# Patient Record
Sex: Female | Born: 1945 | Race: White | Hispanic: No
Health system: Midwestern US, Community
[De-identification: ages and names within clinical notes are randomized; demographics above are authoritative.]

## PROBLEM LIST (undated history)

## (undated) DIAGNOSIS — Z1231 Encounter for screening mammogram for malignant neoplasm of breast: Secondary | ICD-10-CM

## (undated) DIAGNOSIS — M48062 Spinal stenosis, lumbar region with neurogenic claudication: Secondary | ICD-10-CM

---

## 2019-02-15 ENCOUNTER — Emergency Department: Admit: 2019-02-15 | Payer: MEDICARE | Primary: Family Medicine

## 2019-02-15 ENCOUNTER — Inpatient Hospital Stay
Admit: 2019-02-15 | Discharge: 2019-02-15 | Disposition: A | Discharge status: Home / Self Care | Payer: MEDICARE | Attending: Emergency Medicine

## 2019-02-15 DIAGNOSIS — R102 Pelvic and perineal pain: Secondary | ICD-10-CM

## 2019-02-15 LAB — CBC WITH AUTO DIFFERENTIAL
Basophils %: 1 % (ref 0–1)
Basophils Absolute: 0.1 10*3/uL (ref 0.0–0.1)
Eosinophils %: 3 % (ref 0–7)
Eosinophils Absolute: 0.2 10*3/uL (ref 0.0–0.4)
Granulocyte Absolute Count: 0 10*3/uL (ref 0.00–0.04)
Hematocrit: 42.2 % (ref 35.0–47.0)
Hemoglobin: 13.6 g/dL (ref 11.5–16.0)
Immature Granulocytes: 0 % (ref 0.0–0.5)
Lymphocytes %: 18 % (ref 12–49)
Lymphocytes Absolute: 1 10*3/uL (ref 0.8–3.5)
MCH: 31.6 PG (ref 26.0–34.0)
MCHC: 32.2 g/dL (ref 30.0–36.5)
MCV: 97.9 FL (ref 80.0–99.0)
MPV: 10.7 FL (ref 8.9–12.9)
Monocytes %: 10 % (ref 5–13)
Monocytes Absolute: 0.6 10*3/uL (ref 0.0–1.0)
NRBC Absolute: 0 10*3/uL (ref 0.00–0.01)
Neutrophils %: 68 % (ref 32–75)
Neutrophils Absolute: 3.8 10*3/uL (ref 1.8–8.0)
Nucleated RBCs: 0 PER 100 WBC
Platelets: 189 10*3/uL (ref 150–400)
RBC: 4.31 M/uL (ref 3.80–5.20)
RDW: 11.8 % (ref 11.5–14.5)
WBC: 5.6 10*3/uL (ref 3.6–11.0)

## 2019-02-15 LAB — URINALYSIS WITH MICROSCOPIC
BACTERIA, URINE: NEGATIVE /hpf
Bilirubin, Urine: NEGATIVE
Blood, Urine: NEGATIVE
Glucose, Ur: NEGATIVE mg/dL
Ketones, Urine: NEGATIVE mg/dL
Leukocyte Esterase, Urine: NEGATIVE
Nitrite, Urine: NEGATIVE
Protein, UA: NEGATIVE mg/dL
Specific Gravity, UA: 1.006 (ref 1.003–1.030)
Urobilinogen, UA, POCT: 0.2 EU/dL (ref 0.2–1.0)
pH, UA: 7 (ref 5.0–8.0)

## 2019-02-15 LAB — COMPREHENSIVE METABOLIC PANEL
ALT: 19 U/L (ref 12–78)
AST: 15 U/L (ref 15–37)
Albumin/Globulin Ratio: 1.1 (ref 1.1–2.2)
Albumin: 3.3 g/dL — ABNORMAL LOW (ref 3.5–5.0)
Alkaline Phosphatase: 80 U/L (ref 45–117)
Anion Gap: 5 mmol/L (ref 5–15)
BUN: 14 MG/DL (ref 6–20)
Bun/Cre Ratio: 14 (ref 12–20)
CO2: 28 mmol/L (ref 21–32)
Calcium: 8.8 MG/DL (ref 8.5–10.1)
Chloride: 109 mmol/L — ABNORMAL HIGH (ref 97–108)
Creatinine: 0.98 MG/DL (ref 0.55–1.02)
EGFR IF NonAfrican American: 56 mL/min/{1.73_m2} — ABNORMAL LOW (ref >60)
GFR African American: >60 mL/min/{1.73_m2} (ref >60)
Globulin: 3.1 g/dL (ref 2.0–4.0)
Glucose: 85 mg/dL (ref 65–100)
Potassium: 3.8 mmol/L (ref 3.5–5.1)
Sodium: 142 mmol/L (ref 136–145)
Total Bilirubin: 0.5 MG/DL (ref 0.2–1.0)
Total Protein: 6.4 g/dL (ref 6.4–8.2)

## 2019-02-15 LAB — LIPASE
Lipase: 128 U/L (ref 73–393)
Lipase: 128 U/L (ref 73–393)

## 2019-02-15 LAB — URINALYSIS W/MICROSCOPIC
Bacteria: NEGATIVE /hpf
Bilirubin: NEGATIVE
Blood: NEGATIVE
Glucose: NEGATIVE mg/dL
Ketone: NEGATIVE mg/dL
Leukocyte Esterase: NEGATIVE
Nitrites: NEGATIVE
Protein: NEGATIVE mg/dL
Specific gravity: 1.006 (ref 1.003–1.030)
Urobilinogen: 0.2 EU/dL (ref 0.2–1.0)
pH (UA): 7 (ref 5.0–8.0)

## 2019-02-15 LAB — CBC WITH AUTOMATED DIFF
ABS. BASOPHILS: 0.1 10*3/uL (ref 0.0–0.1)
ABS. EOSINOPHILS: 0.2 10*3/uL (ref 0.0–0.4)
ABS. IMM. GRANS.: 0 10*3/uL (ref 0.00–0.04)
ABS. LYMPHOCYTES: 1 10*3/uL (ref 0.8–3.5)
ABS. MONOCYTES: 0.6 10*3/uL (ref 0.0–1.0)
ABS. NEUTROPHILS: 3.8 10*3/uL (ref 1.8–8.0)
ABSOLUTE NRBC: 0 10*3/uL (ref 0.00–0.01)
BASOPHILS: 1 % (ref 0–1)
EOSINOPHILS: 3 % (ref 0–7)
HCT: 42.2 % (ref 35.0–47.0)
HGB: 13.6 g/dL (ref 11.5–16.0)
IMMATURE GRANULOCYTES: 0 % (ref 0.0–0.5)
LYMPHOCYTES: 18 % (ref 12–49)
MCH: 31.6 PG (ref 26.0–34.0)
MCHC: 32.2 g/dL (ref 30.0–36.5)
MCV: 97.9 FL (ref 80.0–99.0)
MONOCYTES: 10 % (ref 5–13)
MPV: 10.7 FL (ref 8.9–12.9)
NEUTROPHILS: 68 % (ref 32–75)
NRBC: 0 PER 100 WBC
PLATELET: 189 10*3/uL (ref 150–400)
RBC: 4.31 M/uL (ref 3.80–5.20)
RDW: 11.8 % (ref 11.5–14.5)
WBC: 5.6 10*3/uL (ref 3.6–11.0)

## 2019-02-15 LAB — METABOLIC PANEL, COMPREHENSIVE
A-G Ratio: 1.1 (ref 1.1–2.2)
ALT (SGPT): 19 U/L (ref 12–78)
AST (SGOT): 15 U/L (ref 15–37)
Albumin: 3.3 g/dL — ABNORMAL LOW (ref 3.5–5.0)
Alk. phosphatase: 80 U/L (ref 45–117)
Anion gap: 5 mmol/L (ref 5–15)
BUN/Creatinine ratio: 14 (ref 12–20)
BUN: 14 MG/DL (ref 6–20)
Bilirubin, total: 0.5 MG/DL (ref 0.2–1.0)
CO2: 28 mmol/L (ref 21–32)
Calcium: 8.8 MG/DL (ref 8.5–10.1)
Chloride: 109 mmol/L — ABNORMAL HIGH (ref 97–108)
Creatinine: 0.98 MG/DL (ref 0.55–1.02)
GFR est AA: >60 mL/min/{1.73_m2} (ref >60)
GFR est non-AA: 56 mL/min/{1.73_m2} — ABNORMAL LOW (ref >60)
Globulin: 3.1 g/dL (ref 2.0–4.0)
Glucose: 85 mg/dL (ref 65–100)
Potassium: 3.8 mmol/L (ref 3.5–5.1)
Protein, total: 6.4 g/dL (ref 6.4–8.2)
Sodium: 142 mmol/L (ref 136–145)

## 2019-02-15 LAB — SAMPLES BEING HELD

## 2019-02-15 LAB — URINE CULTURE HOLD SAMPLE

## 2019-02-15 MED ORDER — IOPAMIDOL 76 % IV SOLN
76 % | Freq: Once | INTRAVENOUS | Status: AC
Start: 2019-02-15 — End: 2019-02-15
  Administered 2019-02-15: 17:00:00 via INTRAVENOUS

## 2019-02-15 MED FILL — ISOVUE-370  76 % INTRAVENOUS SOLUTION: 370 mg iodine /mL (76 %) | INTRAVENOUS | Qty: 100

## 2019-02-15 NOTE — ED Notes (Signed)
Pt resting on stretcher in position of comfort. Provided w/ small amt of ice chips. Denies any other needs at this time.

## 2019-02-15 NOTE — ED Provider Notes (Signed)
Stacie Smith is a 73 y.o. female with PMH of rectocele, pelvic pain since Feb 2020 presents to emergency room ambulatory for evaluation of improving, sharp, stabbing vaginal pain that has been present since February 2020, worsened over the past month.  She moved to [place] from [place] in April.  She is being followed by Dr. Sheran Fava for this problem, has not yet seen a uro-gynecologist but has an appointment September 30 to see "Dr. Jinny Blossom".  Patient states over the past 2 days of vaginal, sharp stabbing pain has increased in severity prompting her to take a dose of gabapentin and tramadol that she takes for other pain for this issue.  She denies fever, chills, vomiting, diarrhea, constipation.  Her pain is located inside her vagina and suprapubic area. She has a hx of multiple vaginal births. Surgical hx- TAH.  Her PCP advised her to take a 21-day course of Azo for symptomatic control believed to be from bladder spasms. She was most recently followed by her PCP last Wednesday, had a normal urinalysis and that was when uro-/GYN was recommended.  At time of exam she had improvement in her symptoms today.     PCP: Alinda Dooms, MD    Surgical hx- see chart    The patient has no other complaints at this time.      Please note that this dictation was completed with Dragon, Acupuncturist. ??Quite often unanticipated grammatical, syntax, homophones, and other interpretive errors are inadvertently transcribed by the computer software. ??Please disregard these errors. ??Additionally, please excuse any errors that have escaped final proofreading.             No past medical history on file.    No past surgical history on file.      No family history on file.    Social History     Socioeconomic History   ??? Marital status: WIDOWED     Spouse name: Not on file   ??? Number of children: Not on file   ??? Years of education: Not on file   ??? Highest education level: Not on file   Occupational History    ??? Not on file   Social Needs   ??? Financial resource strain: Not on file   ??? Food insecurity     Worry: Not on file     Inability: Not on file   ??? Transportation needs     Medical: Not on file     Non-medical: Not on file   Tobacco Use   ??? Smoking status: Not on file   Substance and Sexual Activity   ??? Alcohol use: Not on file   ??? Drug use: Not on file   ??? Sexual activity: Not on file   Lifestyle   ??? Physical activity     Days per week: Not on file     Minutes per session: Not on file   ??? Stress: Not on file   Relationships   ??? Social Product manager on phone: Not on file     Gets together: Not on file     Attends religious service: Not on file     Active member of club or organization: Not on file     Attends meetings of clubs or organizations: Not on file     Relationship status: Not on file   ??? Intimate partner violence     Fear of current or ex partner: Not on file  Emotionally abused: Not on file     Physically abused: Not on file     Forced sexual activity: Not on file   Other Topics Concern   ??? Not on file   Social History Narrative   ??? Not on file         ALLERGIES: Sulfa (sulfonamide antibiotics)    Review of Systems   Constitutional: Negative.  Negative for activity change, chills, fatigue and unexpected weight change.   HENT: Negative for trouble swallowing.    Respiratory: Negative for cough, chest tightness, shortness of breath and wheezing.    Cardiovascular: Negative.  Negative for chest pain and palpitations.   Gastrointestinal: Negative.  Negative for abdominal pain, diarrhea, nausea and vomiting.   Genitourinary: Positive for vaginal pain (intermittent). Negative for dysuria, flank pain, frequency and hematuria.   Musculoskeletal: Negative.  Negative for arthralgias, back pain, neck pain and neck stiffness.   Skin: Negative.  Negative for color change and rash.   Neurological: Negative.  Negative for dizziness, numbness and headaches.   All other systems reviewed and are negative.       Vitals:    02/15/19 1315 02/15/19 1330 02/15/19 1345 02/15/19 1400   BP: 127/58 135/57 126/58 139/66   Pulse:       Resp:       Temp:       SpO2: 97% 96% 96% 97%   Weight:                Physical Exam  Vitals signs and nursing note reviewed.   Constitutional:       General: She is not in acute distress.     Appearance: She is well-developed. She is not toxic-appearing or diaphoretic.   HENT:      Head: Normocephalic and atraumatic.   Eyes:      General:         Right eye: No discharge.         Left eye: No discharge.      Conjunctiva/sclera: Conjunctivae normal.      Pupils: Pupils are equal, round, and reactive to light.   Neck:      Musculoskeletal: Full passive range of motion without pain and normal range of motion.      Trachea: No tracheal tenderness.   Cardiovascular:      Rate and Rhythm: Normal rate and regular rhythm.      Pulses: Normal pulses.      Heart sounds: Normal heart sounds. No murmur. No friction rub. No gallop.    Pulmonary:      Effort: Pulmonary effort is normal. No respiratory distress.      Breath sounds: Normal breath sounds. No wheezing or rales.   Chest:      Chest wall: No tenderness.   Abdominal:      General: Bowel sounds are normal. There is no distension.      Palpations: Abdomen is soft.      Tenderness: There is no abdominal tenderness. There is no guarding or rebound.   Genitourinary:     General: Normal vulva.      Exam position: Supine.      Labia:         Right: No rash or tenderness.         Left: No rash or tenderness.       Urethra: No prolapse.       Musculoskeletal: Normal range of motion.         General:  No tenderness.   Skin:     General: Skin is warm and dry.      Capillary Refill: Capillary refill takes less than 2 seconds.      Findings: No abrasion, erythema or rash.   Neurological:      Mental Status: She is alert and oriented to person, place, and time.      Cranial Nerves: No cranial nerve deficit.      Sensory: No sensory deficit.       Coordination: Coordination normal.   Psychiatric:         Speech: Speech normal.         Behavior: Behavior normal.          MDM  Number of Diagnoses or Management Options  Rectal prolapse:   Vaginal pain:   Diagnosis management comments:   Ddx: UTI, electrolyte abnormality       Amount and/or Complexity of Data Reviewed  Clinical lab tests: reviewed and ordered  Tests in the radiology section of CPT??: ordered and reviewed  Review and summarize past medical records: yes  Discuss the patient with other providers: yes    Patient Progress  Patient progress: stable         Procedures      I discussed patient's PMH, exam findings as well as careplan with the ER attending who agrees with care plan.  Chaya Jan, PA-C    I spoke with Dr. Erlene Senters, reading radiologist.  She states the vagina and rectum appear "mildly prolapsed only on the lateral view" but are not prolapsed clinically. She clinically had no urethral, vaginal or rectal prolapse on initial exam. She was supine for initial exam. I again reassessed her after CT result of vaginal and rectal prolapse and she was in the right lateral recumbant position and no rectal or vaginal prolapse seen. I discussed results from CT with patient. I discussed with Dr. Julieanne Manson who recommends discharge with close f/u with uro-gyn as her appointment currently isn't until 9/30.   She is pain-free. I spoke with the secretary for [facility] and she can have patient be seen as soon as 6:10pm tonight with Dr. Joycie Peek at their evening clinic or 3:10pm tomorrow with the NP Manuela Schwartz who both do uro-gyn. I spoke with patient who states she would rather wait until tomorrow's 3:10pm appointment. Return precautions discussed. I updated Dr. Julieanne Manson of patients HPI, exam, CT finding and close uro-gyn f/u tomorrow who agrees with care plan.     LABORATORY TESTS:  Recent Results (from the past 12 hour(s))   SAMPLES BEING HELD    Collection Time: 02/15/19 11:18 AM    Result Value Ref Range    SAMPLES BEING HELD 1PST,1SST,1RD,1BL     COMMENT        Add-on orders for these samples will be processed based on acceptable specimen integrity and analyte stability, which may vary by analyte.   CBC WITH AUTOMATED DIFF    Collection Time: 02/15/19 11:18 AM   Result Value Ref Range    WBC 5.6 3.6 - 11.0 K/uL    RBC 4.31 3.80 - 5.20 M/uL    HGB 13.6 11.5 - 16.0 g/dL    HCT 42.2 35.0 - 47.0 %    MCV 97.9 80.0 - 99.0 FL    MCH 31.6 26.0 - 34.0 PG    MCHC 32.2 30.0 - 36.5 g/dL    RDW 11.8 11.5 - 14.5 %    PLATELET 189 150 - 400  K/uL    MPV 10.7 8.9 - 12.9 FL    NRBC 0.0 0 PER 100 WBC    ABSOLUTE NRBC 0.00 0.00 - 0.01 K/uL    NEUTROPHILS 68 32 - 75 %    LYMPHOCYTES 18 12 - 49 %    MONOCYTES 10 5 - 13 %    EOSINOPHILS 3 0 - 7 %    BASOPHILS 1 0 - 1 %    IMMATURE GRANULOCYTES 0 0.0 - 0.5 %    ABS. NEUTROPHILS 3.8 1.8 - 8.0 K/UL    ABS. LYMPHOCYTES 1.0 0.8 - 3.5 K/UL    ABS. MONOCYTES 0.6 0.0 - 1.0 K/UL    ABS. EOSINOPHILS 0.2 0.0 - 0.4 K/UL    ABS. BASOPHILS 0.1 0.0 - 0.1 K/UL    ABS. IMM. GRANS. 0.0 0.00 - 0.04 K/UL    DF AUTOMATED     METABOLIC PANEL, COMPREHENSIVE    Collection Time: 02/15/19 11:18 AM   Result Value Ref Range    Sodium 142 136 - 145 mmol/L    Potassium 3.8 3.5 - 5.1 mmol/L    Chloride 109 (H) 97 - 108 mmol/L    CO2 28 21 - 32 mmol/L    Anion gap 5 5 - 15 mmol/L    Glucose 85 65 - 100 mg/dL    BUN 14 6 - 20 MG/DL    Creatinine 0.98 0.55 - 1.02 MG/DL    BUN/Creatinine ratio 14 12 - 20      GFR est AA >60 >60 ml/min/1.64m    GFR est non-AA 56 (L) >60 ml/min/1.78m   Calcium 8.8 8.5 - 10.1 MG/DL    Bilirubin, total 0.5 0.2 - 1.0 MG/DL    ALT (SGPT) 19 12 - 78 U/L    AST (SGOT) 15 15 - 37 U/L    Alk. phosphatase 80 45 - 117 U/L    Protein, total 6.4 6.4 - 8.2 g/dL    Albumin 3.3 (L) 3.5 - 5.0 g/dL    Globulin 3.1 2.0 - 4.0 g/dL    A-G Ratio 1.1 1.1 - 2.2     LIPASE    Collection Time: 02/15/19 11:18 AM   Result Value Ref Range    Lipase 128 73 - 393 U/L    URINALYSIS W/MICROSCOPIC    Collection Time: 02/15/19 12:42 PM   Result Value Ref Range    Color YELLOW/STRAW      Appearance CLEAR CLEAR      Specific gravity 1.006 1.003 - 1.030      pH (UA) 7.0 5.0 - 8.0      Protein Negative NEG mg/dL    Glucose Negative NEG mg/dL    Ketone Negative NEG mg/dL    Bilirubin Negative NEG      Blood Negative NEG      Urobilinogen 0.2 0.2 - 1.0 EU/dL    Nitrites Negative NEG      Leukocyte Esterase Negative NEG      WBC 0-4 0 - 4 /hpf    RBC 0-5 0 - 5 /hpf    Epithelial cells FEW FEW /lpf    Bacteria Negative NEG /hpf    Hyaline cast 0-2 0 - 5 /lpf   URINE CULTURE HOLD SAMPLE    Collection Time: 02/15/19 12:42 PM    Specimen: Serum; Urine   Result Value Ref Range    Urine culture hold        Urine on hold in Microbiology dept for 2 days.  If unpreserved urine is submitted, it cannot be used for addtional testing after 24 hours, recollection will be required.       IMAGING RESULTS:  Ct Abd Pelv W Cont    Result Date: 02/15/2019  IMPRESSION: Findings are concerning for rectal and vaginal prolapse. This could further be assessed by MRI defecography.       MEDICATIONS GIVEN:  Medications   iopamidoL (ISOVUE-370) 76 % injection 100 mL (100 mL IntraVENous Given 02/15/19 1256)         DISCHARGE NOTE:  The patient's results have been reviewed with them and/or available family. Patient and/or family verbally conveyed their understanding and agreement of the patient's signs, symptoms, diagnosis, treatment and prognosis and additionally agree to follow up as recommended in the discharge instructions or to return to the Emergency Room should their condition change prior to their follow-up appointment. The patient/family verbally agrees with the care-plan and verbally conveys that all of their questions have been answered. The discharge instructions have also been provided to the patient and/or family with some educational information  regarding the patient's diagnosis as well a list of reasons why the patient would want to return to the ER prior to their follow-up appointment, should their condition change.      Plan:  1. F/U with uro-gyn tomorrow at 3:10pm  2. Continue pain meds as directed if needed  3. Return precautions discussed and advised to return to ER if worse

## 2019-02-15 NOTE — ED Notes (Signed)
Pt resting on stretcher in position of comfort. Provided w/ small amt of ice chips. Denies any other needs at this time.

## 2019-02-15 NOTE — ED Triage Notes (Signed)
Low abd pain, vaginal pain and diarhrea x 5 months. (-) for UTI.

## 2019-02-15 NOTE — ED Provider Notes (Signed)
ED Provider Notes by Chaya Jan, PA-C at 02/15/19 1518                Author: Chaya Jan, PA-C  Service: Emergency Medicine  Author Type: Physician Assistant       Filed: 02/15/19 1700  Date of Service: 02/15/19 1518  Status: Attested           Editor: Chaya Jan, PA-C (Physician Assistant)  Cosigner: Clyde Canterbury, MD at 02/16/19 1125          Attestation signed by Clyde Canterbury, MD at 02/16/19 1125          I was personally available for consultation in the emergency department.  I have reviewed the chart and agree with the documentation recorded by the [facility], including  the assessment, treatment plan, and disposition.   Clyde Canterbury, MD                                  Stacie Smith is a 73 y.o. female with PMH of rectocele, pelvic pain since Feb 2020 presents to emergency room  ambulatory for evaluation of improving, sharp, stabbing vaginal pain that has been present since February 2020, worsened over the past month.  She moved to [place] from [place] in April.  She is being followed by Dr. Sheran Fava for this problem, has not yet  seen a uro-gynecologist but has an appointment September 30 to see "Dr. Jinny Blossom".  Patient states over the past 2 days of vaginal, sharp stabbing pain has increased in severity prompting her to take a dose of gabapentin and tramadol that she takes for other  pain for this issue.  She denies fever, chills, vomiting, diarrhea, constipation.  Her pain is located inside her vagina and suprapubic area. She has a hx of multiple vaginal births. Surgical hx- TAH.  Her PCP advised her to take a 21-day course of Azo  for symptomatic control believed to be from bladder spasms. She was most recently followed by her PCP last Wednesday, had a normal urinalysis and that was when uro-/GYN was recommended.  At time of exam she had improvement in her symptoms today.       PCP: Alinda Dooms, MD      Surgical hx- see chart      The patient has no other complaints  at this time.         Please note that this dictation was completed with Dragon, Acupuncturist. ??Quite often unanticipated grammatical, syntax, homophones, and other interpretive errors are inadvertently transcribed by the computer software. ??Please  disregard these errors. ??Additionally, please excuse any errors that have escaped final proofreading.                   No past medical history on file.      No past surgical history on file.        No family history on file.        Social History          Socioeconomic History         ?  Marital status:  WIDOWED              Spouse name:  Not on file         ?  Number of children:  Not on file     ?  Years of education:  Not on file     ?  Highest education level:  Not on file       Occupational History        ?  Not on file       Social Needs         ?  Financial resource strain:  Not on file        ?  Food insecurity              Worry:  Not on file         Inability:  Not on file        ?  Transportation needs              Medical:  Not on file         Non-medical:  Not on file       Tobacco Use         ?  Smoking status:  Not on file       Substance and Sexual Activity         ?  Alcohol use:  Not on file     ?  Drug use:  Not on file     ?  Sexual activity:  Not on file       Lifestyle        ?  Physical activity              Days per week:  Not on file         Minutes per session:  Not on file         ?  Stress:  Not on file       Relationships        ?  Social Health visitor on phone:  Not on file         Gets together:  Not on file         Attends religious service:  Not on file         Active member of club or organization:  Not on file         Attends meetings of clubs or organizations:  Not on file         Relationship status:  Not on file        ?  Intimate partner violence              Fear of current or ex partner:  Not on file         Emotionally abused:  Not on file         Physically abused:  Not on file          Forced sexual activity:  Not on file        Other Topics  Concern        ?  Not on file       Social History Narrative        ?  Not on file              ALLERGIES: Sulfa (sulfonamide antibiotics)      Review of Systems    Constitutional: Negative.  Negative for activity change, chills, fatigue and unexpected weight change.    HENT: Negative for trouble swallowing.     Respiratory: Negative for cough, chest tightness, shortness of breath and wheezing.     Cardiovascular: Negative.  Negative for  chest pain and palpitations.    Gastrointestinal: Negative.  Negative for abdominal pain, diarrhea, nausea and vomiting.    Genitourinary: Positive for vaginal pain (intermittent) . Negative for dysuria, flank pain, frequency and hematuria.    Musculoskeletal: Negative.  Negative for arthralgias, back pain, neck pain and neck stiffness.    Skin: Negative.  Negative for color change and rash.    Neurological: Negative.  Negative for dizziness, numbness and headaches.    All other systems reviewed and are negative.           Vitals:             02/15/19 1315  02/15/19 1330  02/15/19 1345  02/15/19 1400           BP:  127/58  135/57  126/58  139/66     Pulse:             Resp:             Temp:             SpO2:  97%  96%  96%  97%           Weight:                        Physical Exam   Vitals signs and nursing note reviewed.   Constitutional:        General: She is not in acute distress.     Appearance: She is well-developed. She is not toxic-appearing or diaphoretic.    HENT:       Head: Normocephalic and atraumatic.   Eyes :       General:         Right eye: No discharge.         Left eye: No discharge.      Conjunctiva/sclera: Conjunctivae normal.      Pupils: Pupils are equal, round, and reactive to light.   Neck:       Musculoskeletal: Full passive range of motion without pain and normal range of motion.      Trachea: No tracheal tenderness.    Cardiovascular:       Rate and Rhythm: Normal rate and regular rhythm.       Pulses: Normal pulses.      Heart sounds: Normal heart sounds. No murmur. No friction rub. No gallop.    Pulmonary:       Effort: Pulmonary effort is normal. No respiratory distress.      Breath sounds: Normal breath sounds. No wheezing or rales.   Chest:       Chest wall: No tenderness.   Abdominal :      General: Bowel sounds are normal. There is no distension.      Palpations: Abdomen is soft.      Tenderness: There is no abdominal tenderness. There is no guarding or rebound.    Genitourinary :      General: Normal vulva.      Exam position: Supine.      Labia:         Right: No rash or tenderness.         Left: No rash or tenderness.       Urethra: No prolapse.         Musculoskeletal : Normal range of motion.          General: No tenderness.    Skin:      General: Skin  is warm and dry.      Capillary Refill: Capillary refill takes less than 2 seconds.      Findings: No abrasion, erythema or rash.   Neurological:       Mental Status: She is alert and oriented to person, place, and time.      Cranial Nerves: No cranial nerve deficit.      Sensory: No sensory deficit.      Coordination: Coordination normal.   Psychiatric:         Speech: Speech normal.          Behavior: Behavior normal.              MDM   Number of Diagnoses or Management Options   Rectal prolapse:    Vaginal pain:    Diagnosis management comments:    Ddx: UTI, electrolyte abnormality          Amount and/or Complexity of Data Reviewed   Clinical lab tests: reviewed and ordered   Tests in the radiology section of CPT??: ordered and reviewed   Review and summarize past medical records: yes   Discuss the patient with other providers: yes      Patient Progress   Patient progress: stable             Procedures         I discussed patient's PMH, exam findings as well as careplan with the ER attending who agrees with care plan.   Chaya Jan, PA-C      I spoke with Dr. Erlene Senters, reading radiologist.  She states the vagina and rectum appear  "mildly prolapsed only on the lateral view" but are not prolapsed clinically.  She clinically had no urethral, vaginal or rectal prolapse on initial exam. She was supine for initial exam. I again reassessed her after CT result of vaginal and rectal prolapse and she was in the right lateral recumbant position and no rectal or vaginal  prolapse seen. I discussed results from CT with patient. I discussed with Dr. Julieanne Manson who recommends discharge with close f/u with uro-gyn as her appointment currently isn't until 9/30.    She is pain-free. I spoke with the secretary for [facility] and she can have patient be seen as soon as 6:10pm tonight with Dr. Joycie Peek at their evening clinic or 3:10pm tomorrow with the NP Manuela Schwartz who both do uro-gyn. I spoke with patient who states she  would rather wait until tomorrow's 3:10pm appointment. Return precautions discussed. I updated Dr. Julieanne Manson of patients HPI, exam, CT finding and close uro-gyn f/u tomorrow who agrees with care plan.       LABORATORY TESTS:     Recent Results (from the past 12 hour(s))     SAMPLES BEING HELD          Collection Time: 02/15/19 11:18 AM         Result  Value  Ref Range            SAMPLES BEING HELD  1PST,1SST,1RD,1BL         COMMENT                  Add-on orders for these samples will be processed based on acceptable specimen integrity and analyte stability, which may vary by analyte.       CBC WITH AUTOMATED DIFF          Collection Time: 02/15/19 11:18 AM         Result  Value  Ref Range            WBC  5.6  3.6 - 11.0 K/uL       RBC  4.31  3.80 - 5.20 M/uL       HGB  13.6  11.5 - 16.0 g/dL       HCT  42.2  35.0 - 47.0 %       MCV  97.9  80.0 - 99.0 FL       MCH  31.6  26.0 - 34.0 PG       MCHC  32.2  30.0 - 36.5 g/dL       RDW  11.8  11.5 - 14.5 %       PLATELET  189  150 - 400 K/uL       MPV  10.7  8.9 - 12.9 FL       NRBC  0.0  0 PER 100 WBC       ABSOLUTE NRBC  0.00  0.00 - 0.01 K/uL       NEUTROPHILS  68  32 - 75 %       LYMPHOCYTES  18   12 - 49 %       MONOCYTES  10  5 - 13 %       EOSINOPHILS  3  0 - 7 %       BASOPHILS  1  0 - 1 %       IMMATURE GRANULOCYTES  0  0.0 - 0.5 %       ABS. NEUTROPHILS  3.8  1.8 - 8.0 K/UL       ABS. LYMPHOCYTES  1.0  0.8 - 3.5 K/UL       ABS. MONOCYTES  0.6  0.0 - 1.0 K/UL       ABS. EOSINOPHILS  0.2  0.0 - 0.4 K/UL       ABS. BASOPHILS  0.1  0.0 - 0.1 K/UL       ABS. IMM. GRANS.  0.0  0.00 - 0.04 K/UL       DF  AUTOMATED          METABOLIC PANEL, COMPREHENSIVE          Collection Time: 02/15/19 11:18 AM         Result  Value  Ref Range            Sodium  142  136 - 145 mmol/L       Potassium  3.8  3.5 - 5.1 mmol/L       Chloride  109 (H)  97 - 108 mmol/L       CO2  28  21 - 32 mmol/L       Anion gap  5  5 - 15 mmol/L       Glucose  85  65 - 100 mg/dL       BUN  14  6 - 20 MG/DL       Creatinine  0.98  0.55 - 1.02 MG/DL       BUN/Creatinine ratio  14  12 - 20         GFR est AA  >60  >60 ml/min/1.14m       GFR est non-AA  56 (L)  >60 ml/min/1.728m      Calcium  8.8  8.5 - 10.1 MG/DL       Bilirubin, total  0.5  0.2 - 1.0 MG/DL  ALT (SGPT)  19  12 - 78 U/L       AST (SGOT)  15  15 - 37 U/L       Alk. phosphatase  80  45 - 117 U/L       Protein, total  6.4  6.4 - 8.2 g/dL       Albumin  3.3 (L)  3.5 - 5.0 g/dL       Globulin  3.1  2.0 - 4.0 g/dL       A-G Ratio  1.1  1.1 - 2.2         LIPASE          Collection Time: 02/15/19 11:18 AM         Result  Value  Ref Range            Lipase  128  73 - 393 U/L       URINALYSIS W/MICROSCOPIC          Collection Time: 02/15/19 12:42 PM         Result  Value  Ref Range            Color  YELLOW/STRAW          Appearance  CLEAR  CLEAR         Specific gravity  1.006  1.003 - 1.030         pH (UA)  7.0  5.0 - 8.0         Protein  Negative  NEG mg/dL       Glucose  Negative  NEG mg/dL       Ketone  Negative  NEG mg/dL       Bilirubin  Negative  NEG         Blood  Negative  NEG         Urobilinogen  0.2  0.2 - 1.0 EU/dL       Nitrites  Negative  NEG         Leukocyte Esterase   Negative  NEG         WBC  0-4  0 - 4 /hpf       RBC  0-5  0 - 5 /hpf       Epithelial cells  FEW  FEW /lpf       Bacteria  Negative  NEG /hpf       Hyaline cast  0-2  0 - 5 /lpf       URINE CULTURE HOLD SAMPLE          Collection Time: 02/15/19 12:42 PM       Specimen: Serum; Urine         Result  Value  Ref Range            Urine culture hold                  Urine on hold in Microbiology dept for 2 days.  If unpreserved urine is submitted, it cannot be used for addtional testing after 24 hours, recollection  will be required.           IMAGING RESULTS:   Ct Abd Pelv W Cont      Result Date: 02/15/2019   IMPRESSION: Findings are concerning for rectal and vaginal prolapse. This could further be assessed by MRI defecography.          MEDICATIONS GIVEN:     Medications       iopamidoL (  ISOVUE-370) 76 % injection 100 mL (100 mL IntraVENous Given 02/15/19 1256)              DISCHARGE NOTE:   The patient's results have been reviewed with them and/or available family. Patient and/or family verbally conveyed their understanding and agreement of the patient's signs, symptoms, diagnosis, treatment  and prognosis and additionally agree to follow up as recommended in the discharge instructions or to return to the Emergency Room should their condition change prior to their follow-up appointment. The patient/family verbally agrees with the care-plan  and verbally conveys that all of their questions have been answered. The discharge instructions have also been provided to the patient and/or family with some educational information regarding the patient's diagnosis as well a list of reasons why the  patient would want to return to the ER prior to their follow-up appointment, should their condition change.         Plan:   1. F/U with uro-gyn tomorrow at 3:10pm   2. Continue pain meds as directed if needed   3. Return precautions discussed and advised to return to ER if worse

## 2019-02-15 NOTE — ED Notes (Signed)
Low abd pain, vaginal pain and diarhrea x 5 months. (-) for UTI.

## 2019-08-31 ENCOUNTER — Encounter

## 2019-09-06 ENCOUNTER — Inpatient Hospital Stay: Admit: 2019-09-06 | Payer: MEDICARE | Attending: Physician Assistant | Primary: Family Medicine

## 2019-09-06 DIAGNOSIS — M48062 Spinal stenosis, lumbar region with neurogenic claudication: Secondary | ICD-10-CM

## 2019-10-17 ENCOUNTER — Ambulatory Visit: Attending: Obstetrics & Gynecology | Primary: Family Medicine

## 2019-10-17 ENCOUNTER — Ambulatory Visit
Admit: 2019-10-17 | Discharge: 2019-10-17 | Payer: MEDICARE | Attending: Obstetrics & Gynecology | Primary: Family Medicine

## 2019-10-17 DIAGNOSIS — Z01419 Encounter for gynecological examination (general) (routine) without abnormal findings: Secondary | ICD-10-CM

## 2019-10-17 NOTE — Progress Notes (Signed)
Progress Notes by Sallye Ober, MD at 10/17/19 0920                Author: Sallye Ober, MD  Service: --  Author Type: Physician       Filed: 10/17/19 1516  Encounter Date: 10/17/2019  Status: Signed          Editor: Sallye Ober, MD (Physician)                         [facility]   [email or web address]   [phone]      Greig Castilla, MD, [place]           Annual Gynecologic Exam:   [facility] s/p hyst     Chief Complaint       Patient presents with        ?  Well Woman           Stacie Smith is a 74 y.o. G4  P39 WHITE   female who is here for an annual exam.      She reports the following additional concerns: Pt reports she has a bump on the left side between the top of her leg & a vaginal area for "quite some time". The bump is itchy & has noticed it for . C/o possible vaginal dryness & she used to use vaginal  cream for it. C/o diarrhea & she is seeing a gastro MD. She sometimes has the diarrhea leak out & she is concerned about a possible infection.    Pt needs a mammogram. She will sign ROI at front desk.   She had a Dexa scan about 3-4 years ago; she reports she has bone loss on one of her legs. Her right leg has bothered her.        Menstrual status:   Her periods are absent with a history of a hysterectomy.      Contraception:   The current method of family planning is a hysterectomy.       Preventive Medicine History:.   Her most recent Pap smear was normal per pt but she is unsure if she has a cervix.   She reports she does not have a history of CIN 2, 3 or cervical cancer.        Breast health:   The patient has not had a recent mammogram. She has never had an abnormal mammogram.       History reviewed. No pertinent past medical history.     Past Surgical History:         Procedure  Laterality  Date          ?  HX APPENDECTOMY         ?  HX CHOLECYSTECTOMY    2008     ?  HX HYSTERECTOMY    08/1996          total. TAH/BSO, aub, no cancer, no ho cin        History  reviewed. No pertinent family history.     Social History          Tobacco Use         ?  Smoking status:  Current Every Day Smoker              Packs/day:  0.25         Years:  57.00         Pack years:  14.25         ?  Smokeless tobacco:  Never Used       Substance Use Topics         ?  Alcohol use:  Yes              Frequency:  Monthly or less         ?  Drug use:  Not on file          Allergies        Allergen  Reactions         ?  Hydrocodone  Itching         ?  Sulfa (Sulfonamide Antibiotics)  Unknown (comments)          Current Outpatient Medications        Medication  Sig         ?  cholecalciferol (Vitamin D3) 25 mcg (1,000 unit) cap  Take  by mouth daily.     ?  ascorbic acid, vitamin C, (Vitamin C) 250 mg tablet  Take  by mouth.     ?  cyanocobalamin (Vitamin B-12) 100 mcg tablet  Take 100 mcg by mouth daily.     ?  zinc sulfate (ZINC-15 PO)  Take  by mouth.     ?  turmeric 400 mg cap  Take  by mouth.         ?  magnesium citrate 100 mg cap  Take  by mouth.          No current facility-administered medications for this visit.               Tobacco History:  reports that she has been smoking. She has a 14.25 pack-year smoking history. She has never used smokeless tobacco.   Alcohol Abuse:  reports current alcohol use.   Drug Abuse:  has no history on file for drug.      There is no problem list on file for this patient.         Review of Systems - History obtained from the patient and patient filled out questionnaire    Constitutional/general, HEENT, CV, Resp, GI, MSK, Neuro, Psych, Heme/lymph, Skin, Breast ROS: no significant complaints except as noted on HPI      Physical Exam   Visit Vitals      BP  (!) 121/58 (BP 1 Location: Right arm, BP Patient Position: Sitting, BP Cuff Size: Adult)     Pulse  75     Ht  5\' 5"  (1.651 m)     Wt  165 lb 9.6 oz (75.1 kg)        BMI  27.56 kg/m??           Constitutional   ??  Appearance: well-nourished, well developed, alert, in no acute distress      HENT   ??  Head and  Face: appears normal      Neck   ??  Inspection/Palpation: normal appearance, no masses or tenderness   ??  Lymph Nodes: no lymphadenopathy present   ??  Thyroid: gland size normal, nontender, no nodules or masses present on palpation      Chest   ??  Respiratory Effort: breathing unlabored   ??  Auscultation: normal breath sounds      Cardiovascular   ??  Heart:   ??  Auscultation: regular rate and rhythm without murmur      Breasts   ??  Inspection of Breasts: breasts symmetrical, no skin changes, no discharge present,  nipple appearance normal, no skin retraction present   ??  Palpation of Breasts and Axillae: no masses present on palpation, no breast tenderness   ??  Axillary Lymph Nodes: no lymphadenopathy present      Gastrointestinal   ??  Abdominal Examination: abdomen non-tender to palpation, normal bowel sounds, no  masses present   ??  Liver and spleen: no hepatomegaly present, spleen not palpable   ??  Hernias: no hernias identified      Genitourinary   ??  External Genitalia: normal appearance for age, no discharge present, no tenderness  present, atrophy present   ??       ??     ??  Vagina: normal vaginal vault without central or paravaginal defects, no discharge present, no inflammatory lesions present, no masses present   ??  Bladder: non-tender to palpation   ??  Urethra: appears normal   ??  Cervix: not present    ??  Uterus: absent   ??  Adnexa: no adnexal tenderness present, no adnexal masses present   ??  Perineum: perineum within normal limits, no evidence of trauma, no rashes or skin lesions present   ??  Anus: anus within normal limits, no hemorrhoids present   ??  Inguinal Lymph Nodes: no lymphadenopathy present      Skin   ??  General Inspection: no rash, no lesions identified      Neurologic/Psychiatric   ??  Mental Status:   ??  Orientation: grossly oriented to person, place and time   ??  Mood and Affect: mood normal, affect appropriate      Assessment:   74 y.o. for well woman exam   History of hysterectomy      Encounter Diagnoses        Name  Primary?         ?  Encounter for well woman exam with routine gynecological exam  Yes     ?  Estrogen deficiency       ?  Vulvar lesion           ?  Abnormal bone density screening             Plan:   The patient was counseled about healthy lifestyle, disease prevention, signs and symptoms of breast cancer and bone protection.   Handouts were given to the patient.    She is instructed to contact our office with any questions or concerns   I recommend following up for her annual well woman exam in 1 year or following up sooner if needed   I recommend follow up with a primary care physician for chronic medical problems and evaluation of non-gynecologic concerns and to please contact our office with any GYN questions or concerns.   Rec fu and vulvar bx: 20 min   BMD           Orders Placed This Encounter        ?  DEXA BONE DENSITY STUDY AXIAL        ?  NUSWAB VG, HSV ([facility])           No results found for any visits on 10/17/19.

## 2019-10-17 NOTE — Progress Notes (Signed)
The results are normal, no clear evidence of vaginal infection  MC message sent if active.  Recommend f/u if still having symptoms/problems or has additional concerns.

## 2019-10-17 NOTE — Progress Notes (Signed)
[facility]  [email or web address]  [phone]    Alfonse Ras, MD, FACOG       Annual Gynecologic Exam:  WWE s/p hyst  Chief Complaint   Patient presents with   ??? Well Woman       Stacie Smith is a 74 y.o. G4 P65 WHITE  female who is here for an annual exam.    She reports the following additional concerns: Pt reports she has a bump on the left side between the top of her leg & a vaginal area for "quite some time". The bump is itchy & has noticed it for . C/o possible vaginal dryness & she used to use vaginal cream for it. C/o diarrhea & she is seeing a gastro MD. She sometimes has the diarrhea leak out & she is concerned about a possible infection.   Pt needs a mammogram. She will sign ROI at front desk.  She had a Dexa scan about 3-4 years ago; she reports she has bone loss on one of her legs. Her right leg has bothered her.      Menstrual status:  Her periods are absent with a history of a hysterectomy.    Contraception:  The current method of family planning is a hysterectomy.     Preventive Medicine History:.  Her most recent Pap smear was normal per pt but she is unsure if she has a cervix.  She reports she does not have a history of CIN 2, 3 or cervical cancer.      Breast health:  The patient has not had a recent mammogram. She has never had an abnormal mammogram.     History reviewed. No pertinent past medical history.  Past Surgical History:   Procedure Laterality Date   ??? HX APPENDECTOMY     ??? HX CHOLECYSTECTOMY  2008   ??? HX HYSTERECTOMY  08/1996    total. TAH/BSO, aub, no cancer, no ho cin     History reviewed. No pertinent family history.  Social History     Tobacco Use   ??? Smoking status: Current Every Day Smoker     Packs/day: 0.25     Years: 57.00     Pack years: 14.25   ??? Smokeless tobacco: Never Used   Substance Use Topics   ??? Alcohol use: Yes     Frequency: Monthly or less   ??? Drug use: Not on file     Allergies   Allergen Reactions   ??? Hydrocodone Itching   ???  Sulfa (Sulfonamide Antibiotics) Unknown (comments)     Current Outpatient Medications   Medication Sig   ??? cholecalciferol (Vitamin D3) 25 mcg (1,000 unit) cap Take  by mouth daily.   ??? ascorbic acid, vitamin C, (Vitamin C) 250 mg tablet Take  by mouth.   ??? cyanocobalamin (Vitamin B-12) 100 mcg tablet Take 100 mcg by mouth daily.   ??? zinc sulfate (ZINC-15 PO) Take  by mouth.   ??? turmeric 400 mg cap Take  by mouth.   ??? magnesium citrate 100 mg cap Take  by mouth.     No current facility-administered medications for this visit.          Tobacco History:  reports that she has been smoking. She has a 14.25 pack-year smoking history. She has never used smokeless tobacco.  Alcohol Abuse:  reports current alcohol use.  Drug Abuse:  has no history on file for drug.    There is no problem  list on file for this patient.      Review of Systems - History obtained from the patient and patient filled out questionnaire   Constitutional/general, HEENT, CV, Resp, GI, MSK, Neuro, Psych, Heme/lymph, Skin, Breast ROS: no significant complaints except as noted on HPI    Physical Exam  Visit Vitals  BP (!) 121/58 (BP 1 Location: Right arm, BP Patient Position: Sitting, BP Cuff Size: Adult)   Pulse 75   Ht 5\' 5"  (1.651 m)   Wt 165 lb 9.6 oz (75.1 kg)   BMI 27.56 kg/m??       Constitutional  ?? Appearance: well-nourished, well developed, alert, in no acute distress    HENT  ?? Head and Face: appears normal    Neck  ?? Inspection/Palpation: normal appearance, no masses or tenderness  ?? Lymph Nodes: no lymphadenopathy present  ?? Thyroid: gland size normal, nontender, no nodules or masses present on palpation    Chest  ?? Respiratory Effort: breathing unlabored  ?? Auscultation: normal breath sounds    Cardiovascular  ?? Heart:  ?? Auscultation: regular rate and rhythm without murmur    Breasts  ?? Inspection of Breasts: breasts symmetrical, no skin changes, no discharge present, nipple appearance normal, no skin retraction present  ?? Palpation of  Breasts and Axillae: no masses present on palpation, no breast tenderness  ?? Axillary Lymph Nodes: no lymphadenopathy present    Gastrointestinal  ?? Abdominal Examination: abdomen non-tender to palpation, normal bowel sounds, no masses present  ?? Liver and spleen: no hepatomegaly present, spleen not palpable  ?? Hernias: no hernias identified    Genitourinary  ?? External Genitalia: normal appearance for age, no discharge present, no tenderness present, atrophy present  ??   ??   ?? Vagina: normal vaginal vault without central or paravaginal defects, no discharge present, no inflammatory lesions present, no masses present  ?? Bladder: non-tender to palpation  ?? Urethra: appears normal  ?? Cervix: not present   ?? Uterus: absent  ?? Adnexa: no adnexal tenderness present, no adnexal masses present  ?? Perineum: perineum within normal limits, no evidence of trauma, no rashes or skin lesions present  ?? Anus: anus within normal limits, no hemorrhoids present  ?? Inguinal Lymph Nodes: no lymphadenopathy present    Skin  ?? General Inspection: no rash, no lesions identified    Neurologic/Psychiatric  ?? Mental Status:  ?? Orientation: grossly oriented to person, place and time  ?? Mood and Affect: mood normal, affect appropriate    Assessment:  74 y.o. for well woman exam  History of hysterectomy  Encounter Diagnoses   Name Primary?   ??? Encounter for well woman exam with routine gynecological exam Yes   ??? Estrogen deficiency    ??? Vulvar lesion    ??? Abnormal bone density screening        Plan:  The patient was counseled about healthy lifestyle, disease prevention, signs and symptoms of breast cancer and bone protection.  Handouts were given to the patient.   She is instructed to contact our office with any questions or concerns  I recommend following up for her annual well woman exam in 1 year or following up sooner if needed  I recommend follow up with a primary care physician for chronic medical problems and evaluation of  non-gynecologic concerns and to please contact our office with any GYN questions or concerns.  Rec fu and vulvar bx: 20 min  BMD      Orders Placed  This Encounter   ??? DEXA BONE DENSITY STUDY AXIAL   ??? NUSWAB VG, HSV ([facility])       No results found for any visits on 10/17/19.

## 2019-10-17 NOTE — Patient Instructions (Signed)
Well Visit, Over 65: Care Instructions  Overview     Well visits can help you stay healthy. Your doctor has checked your overall health and may have suggested ways to take good care of yourself. Your doctor also may have recommended tests. At home, you can help prevent illness with healthy eating, regular exercise, and other steps.  Follow-up care is a key part of your treatment and safety. Be sure to make and go to all appointments, and call your doctor if you are having problems. It's also a good idea to know your test results and keep a list of the medicines you take.  How can you care for yourself at home?  ?? Get screening tests that you and your doctor decide on. Screening helps find diseases before any symptoms appear.  ?? Eat healthy foods. Choose fruits, vegetables, whole grains, protein, and low-fat dairy foods. Limit fat, especially saturated fat. Reduce salt in your diet.  ?? Limit alcohol. If you are a man, have no more than 2 drinks a day or 14 drinks a week. If you are a woman, have no more than 1 drink a day or 7 drinks a week. Since alcohol affects older adults differently, you may want to limit alcohol even more. Or you may not want to drink at all.  ?? Get at least 30 minutes of exercise on most days of the week. Walking is a good choice. You also may want to do other activities, such as running, swimming, cycling, or playing tennis or team sports.  ?? Reach and stay at a healthy weight. This will lower your risk for many problems, such as obesity, diabetes, heart disease, and high blood pressure.  ?? Do not smoke. Smoking can make health problems worse. If you need help quitting, talk to your doctor about stop-smoking programs and medicines. These can increase your chances of quitting for good.  ?? Care for your mental health. It is easy to get weighed down by worry and stress. Learn strategies to manage stress, like deep breathing and mindfulness, and stay connected with your family and community.  If you find you often feel sad or hopeless, talk with your doctor. Treatment can help.  ?? Talk to your doctor about whether you have any risk factors for sexually transmitted infections (STIs). You can help prevent STIs if you wait to have sex with a new partner (or partners) until you've each been tested for STIs. It also helps if you use condoms (female or female condoms) and if you limit your sex partners to one person who only has sex with you. Vaccines are available for some STIs.  ?? If you think you may have a problem with alcohol or drug use, talk to your doctor. This includes prescription medicines (such as amphetamines and opioids) and illegal drugs (such as cocaine and methamphetamine). Your doctor can help you figure out what type of treatment is best for you.  ?? Protect your skin from too much sun. When you're outdoors from 10 a.m. to 4 p.m., stay in the shade or cover up with clothing and a hat with a wide brim. Wear sunglasses that block UV rays. Even when it's cloudy, put broad-spectrum sunscreen (SPF 30 or higher) on any exposed skin.  ?? See a dentist one or two times a year for checkups and to have your teeth cleaned.  ?? Wear a seat belt in the car.  When should you call for help?  Watch closely for changes in   your health, and be sure to contact your doctor if you have any problems or symptoms that concern you.  Where can you learn more?  Go to https://www.healthwise.net/GoodHelpConnections  Enter K859 in the search box to learn more about "Well Visit, Over 65: Care Instructions."  Current as of: Nov 17, 2018??????????????????????????????Content Version: 12.8  ?? 2006-2021 Healthwise, Incorporated.   Care instructions adapted under license by [employer] (which disclaims liability or warranty for this information). If you have questions about a medical condition or this instruction, always ask your healthcare professional. Healthwise, Incorporated disclaims any warranty or liability for your use of this  information.

## 2019-10-19 ENCOUNTER — Inpatient Hospital Stay: Admit: 2019-10-19 | Payer: MEDICARE | Attending: Obstetrics & Gynecology | Primary: Family Medicine

## 2019-10-19 DIAGNOSIS — Z13820 Encounter for screening for osteoporosis: Secondary | ICD-10-CM

## 2019-10-19 NOTE — Progress Notes (Signed)
Abnormal results.   MyChart message sent, if active.   Notify patient, if MyChart message not read, or not active on [facility].  Update chart, PN labs/problem list, if needed  Add to PMH: osteopenia in notes:  10 year probability of major osteoporotic fracture:  17%  10 year probability of hip fracture:  4.5%  rec fu to review risk factors for bone loss and check vit D level

## 2019-10-19 NOTE — Progress Notes (Signed)
Reached pt via phone; see encounter.    Pt coming in in 11/08/19.    Updated PMH

## 2019-10-19 NOTE — Progress Notes (Signed)
Abnormal results.   MyChart message sent, if active.   Notify patient, if MyChart message not read, or not active on [facility].  Update chart, PN labs/problem list, if needed  Add to PMH: osteopenia in notes:  10 year probability of major osteoporotic fracture:  17%  10 year probability of hip fracture:  4.5%  rec fu to review risk factors for bone loss and check vit D level

## 2019-10-19 NOTE — Progress Notes (Signed)
Reached pt via phone; see encounter.    Pt coming in in 11/08/19.    Updated PMH

## 2019-10-24 LAB — HSV, VAGINITIS (NUSWAB)
C. albicans, NAA: NEGATIVE
C. glabrata, NAA: NEGATIVE
HSV-1 by NAA: NEGATIVE
HSV-2 by NAA: NEGATIVE
T. vaginalis, NAA: NEGATIVE

## 2019-10-26 NOTE — Telephone Encounter (Signed)
Patient of [facility] from 10/17/19:    Calling for bone density rPatient Result Comments for DEXA BONE DENSITY STUDY AXIAL    Written by Baruch Merl, MD on 10/24/2019 ??9:23 PM  There is some bone loss seen on your bone density test called osteopenia and I would like to review your results and risk factors, discuss your options and check your vitamin D level, if needed. Please contact the office to schedule that appointment.     Please bring a copy of any prior bone density tests, if possible.

## 2019-10-26 NOTE — Telephone Encounter (Signed)
Patient has upcoming appt with TP on 11/08/19

## 2019-11-03 NOTE — Telephone Encounter (Signed)
Follow up call to Stacie Smith - Pt verified self and birth date for privacy precautions. Patient was advised of MD's recommendations, pt is coming in on 11/08/2019. Stacie Smith acknowledged understanding and all questions were answered to patients satisfaction. No further questions or concerns at this time.

## 2019-11-03 NOTE — Telephone Encounter (Signed)
-----   Message from Baruch Merl, MD sent at 10/24/2019  9:23 PM EDT -----  Abnormal results.   MyChart message sent, if active.   Notify patient, if MyChart message not read, or not active on [facility].  Update chart, PN labs/problem list, if needed  Add to PMH: osteopenia in notes:  10 year probability of major osteoporotic fracture:  17%  10 year probability of hip fracture:  4.5%  rec fu to review risk factors for bone loss and check vit D level

## 2019-11-08 ENCOUNTER — Encounter

## 2019-11-08 ENCOUNTER — Encounter: Attending: Obstetrics & Gynecology | Primary: Family Medicine

## 2019-11-08 ENCOUNTER — Inpatient Hospital Stay: Admit: 2019-11-08 | Payer: MEDICARE | Primary: Family Medicine

## 2019-11-08 ENCOUNTER — Ambulatory Visit: Admit: 2019-11-08 | Discharge: 2019-11-08 | Payer: MEDICARE | Primary: Family Medicine

## 2019-11-08 DIAGNOSIS — Z1231 Encounter for screening mammogram for malignant neoplasm of breast: Secondary | ICD-10-CM

## 2019-11-08 NOTE — Progress Notes (Signed)
MMG normal/dense.    Please update PMH/HM include date of imaging (mm/dd/yy) add "dense" or "very dense" to comments, see report  If pt desires, see [facility] message, order bilateral screening breast US Order: [id number] for US Whole Breat Screening BI complete.  Note: imaging only done at [facility] and could cost out of pocket ~$500.  Patient may decline, or schedule a consult to discuss breast cancer risks in more detail.

## 2019-11-08 NOTE — Patient Instructions (Incomplete)
Mammogram: About This Test  What is it?     A mammogram is an X-ray of the breast that is used to screen for breast cancer. This test can find tumors that are too small for you or your doctor to feel. Cancer is most easily treated when it is found at an early stage.  Why is this test done?  A mammogram is done to:  ?? Look for breast cancer in women who don't have symptoms.  ?? Find breast cancer in women who have symptoms. Symptoms of breast cancer may include a lump or thickening in the breast, nipple discharge, or dimpling of the skin on one area of the breast.  ?? Find an area of suspicious breast tissue to remove for an exam under a microscope (biopsy).  How do you prepare for the test?  If you've had a mammogram before at another clinic, have the results sent or bring them with you to your appointment.  On the day of the mammogram, don't use any deodorant. And don't use perfume, powders, or ointments near or on your breasts. The residue left on your skin by these substances may interfere with the X-rays.  How is the test done?  ?? You will need to take off any jewelry that might interfere with the X-ray pictures.  ?? You will need to take off your clothes above the waist.  ?? You will be given a cloth or paper gown to use during the test.  ?? You probably will stand during the mammogram.  ?? One at a time, your breasts will be placed on a flat plate.  ?? Another plate is then pressed firmly against your breast to help flatten out the breast tissue. You may be asked to lift your arm.  ?? For a few seconds while the X-ray picture is being taken, you will need to hold your breath.  ?? At least two pictures are taken of each breast. One is taken from the top and one from the side.  How does having a mammogram feel?  A mammogram is often uncomfortable but rarely painful. If you have sensitive or fragile skin or a skin condition, let the technician know before you have your exam. If you have menstrual periods, the  procedure is more comfortable when done within 2 weeks after your period has ended.  Having your breasts flattened is usually uncomfortable, but it helps the technician get the best images.  How long does the test take?  ?? The test will take about 10 to 15 minutes. You may be in the clinic for up to an hour.  ?? You may be asked to wait a few minutes while the images are checked to make sure they don't need to be redone.  What happens after the test?  ?? You will probably be able to go home right away.  ?? You can go back to your usual activities right away.  Follow-up care is a key part of your treatment and safety. Be sure to make and go to all appointments, and call your doctor if you are having problems. It's also a good idea to keep a list of the medicines you take. Ask your doctor when you can expect to have your test results.  Where can you learn more?  Go to https://www.healthwise.net/GoodHelpConnections  Enter Z238 in the search box to learn more about "Mammogram: About This Test."  Current as of: June 09, 2019??????????????????????????????Content Version: 12.8  ?? 2006-2021 Healthwise, Inc[employer].     Care instructions adapted under license by [employer] (which disclaims liability or warranty for this information). If you have questions about a medical condition or this instruction, always ask your healthcare professional. [employer]wise, [employer] disclaims any warranty or liability for your use of this information.

## 2019-11-10 NOTE — Progress Notes (Signed)
Updated PMH.

## 2020-02-10 ENCOUNTER — Encounter: Attending: Obstetrics & Gynecology | Primary: Family Medicine

## 2020-08-06 IMAGING — MR MRI TSPINE WO CONTRAST
4 of 5 series · 33 of 48 positions shown · non-contrast
Comparison: None.

HISTORY: 74-year-old female with mid back pain for several years.
TECHNIQUE: MRI study of the thoracic spine was performed using sagittal and axial images of varying sequences. The examination is performed without contrast.

[Series 16: t2_sag · sagittal · 3.0mm · 0.83mm/px · 5 of 18 slices shown]
[im 1/18]
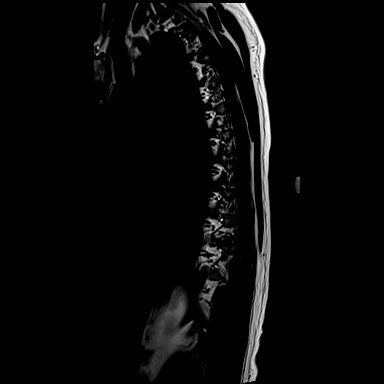
[im 5/18]
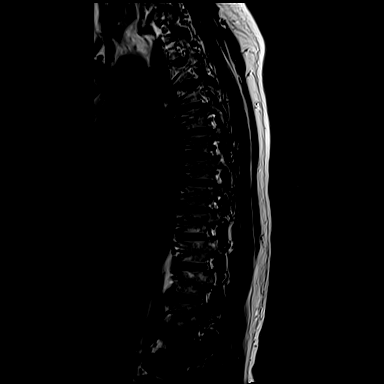
[im 9/18]
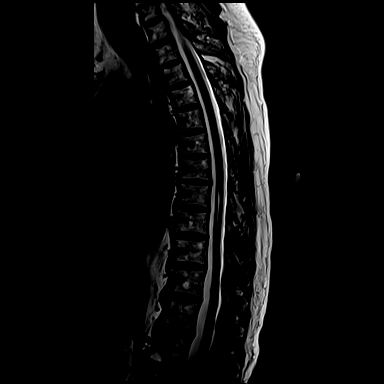
[im 13/18]
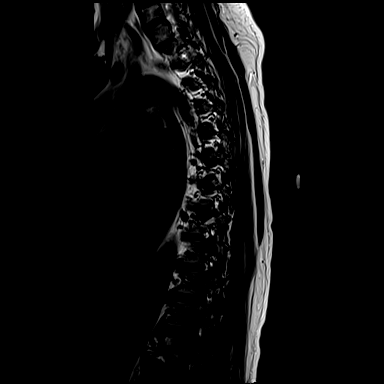
[im 18/18]
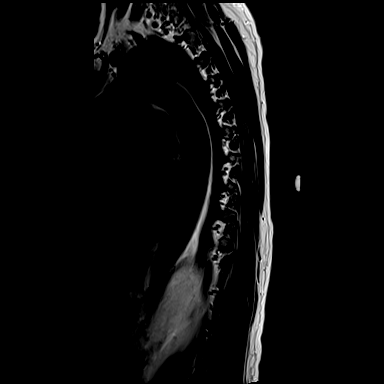

[Series 17: t1_sag · sagittal · 3.0mm · 0.83mm/px · 5 of 18 slices shown]
[im 1/18]
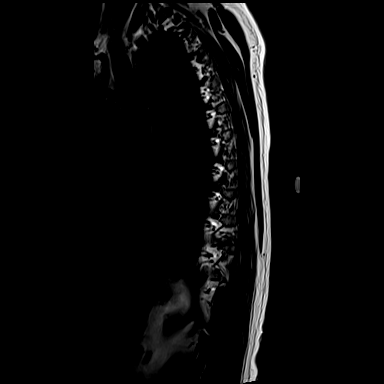
[im 5/18]
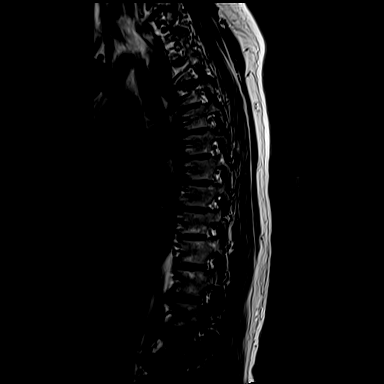
[im 9/18]
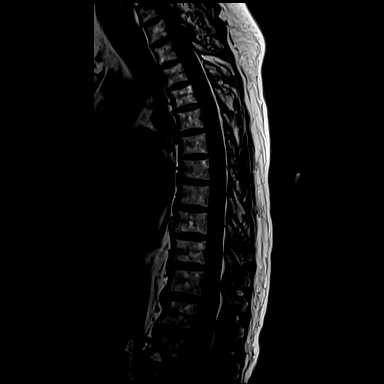
[im 13/18]
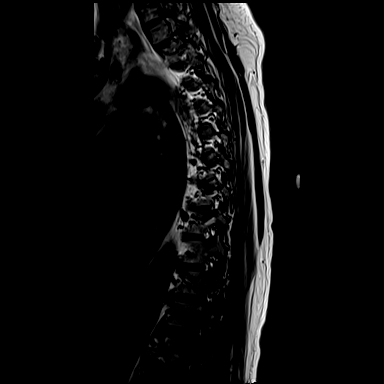
[im 18/18]
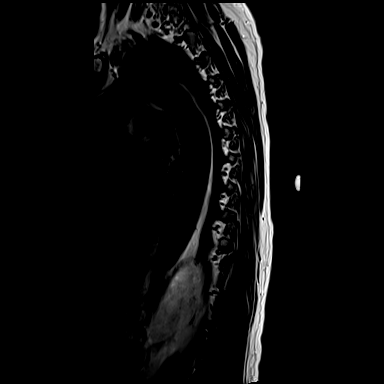

[Series 18: ir_sag · sagittal · 3.0mm · 1.25mm/px · 5 of 18 slices shown]
[im 1/18]
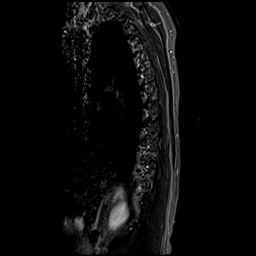
[im 5/18]
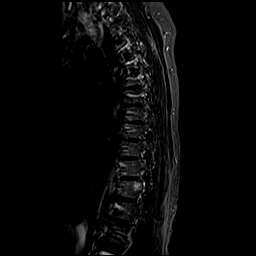
[im 9/18]
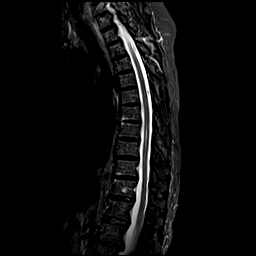
[im 13/18]
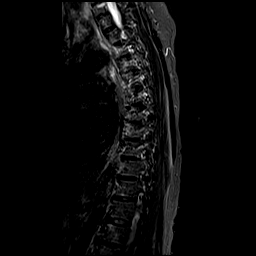
[im 18/18]
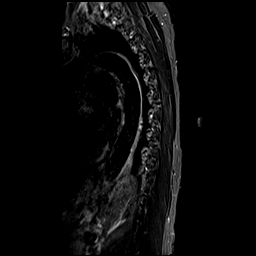

[Series 104: t2_axial_combine · axial · 3.5mm · 0.56mm/px · z∈[-206,+38]mm · 18 of 70 slices shown]
[im 1/70]
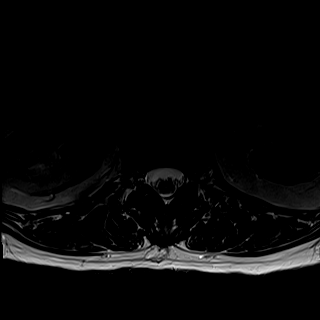
[im 5/70]
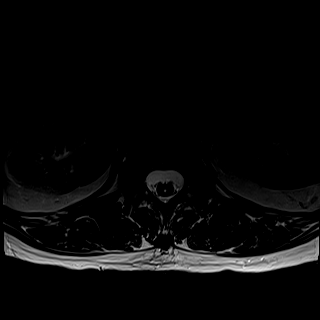
[im 9/70]
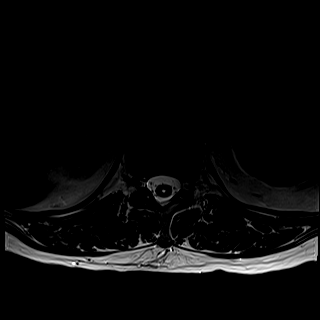
[im 13/70]
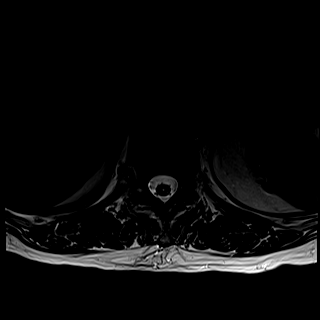
[im 17/70]
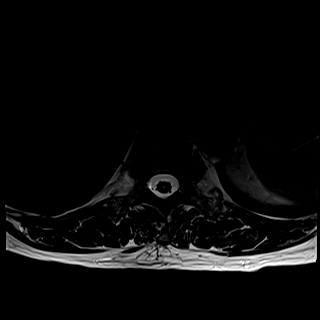
[im 21/70]
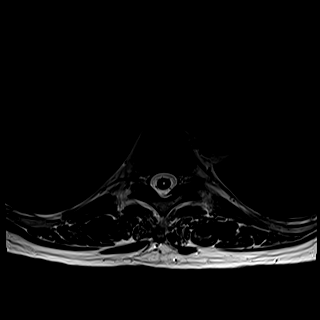
[im 25/70]
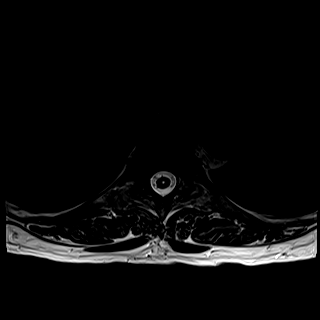
[im 29/70]
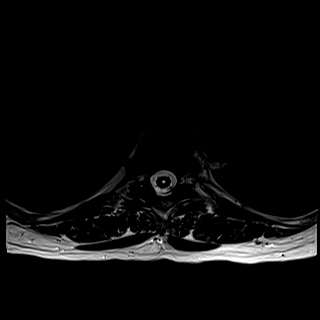
[im 33/70]
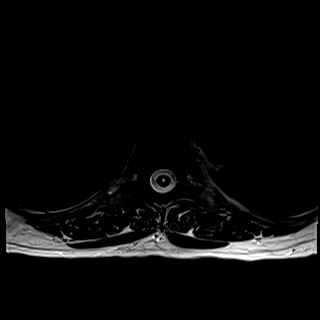
[im 37/70]
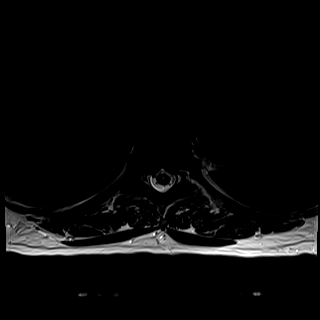
[im 41/70]
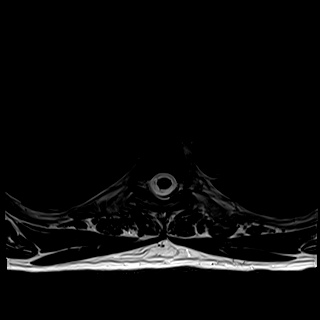
[im 45/70]
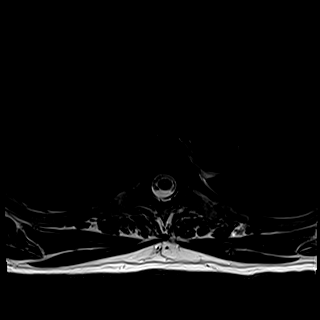
[im 49/70]
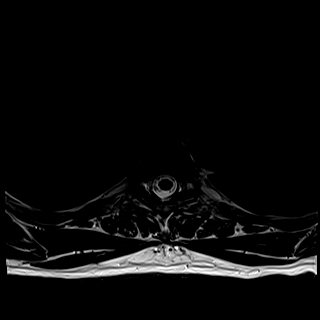
[im 53/70]
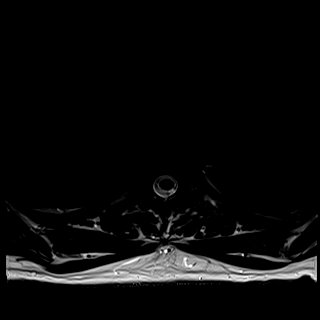
[im 57/70]
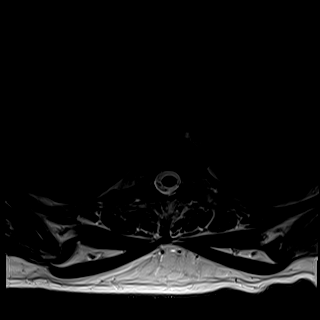
[im 61/70]
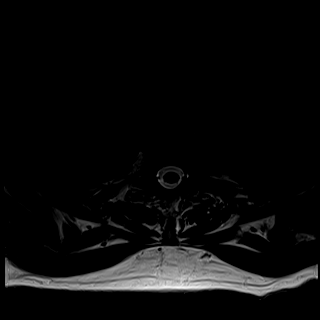
[im 65/70]
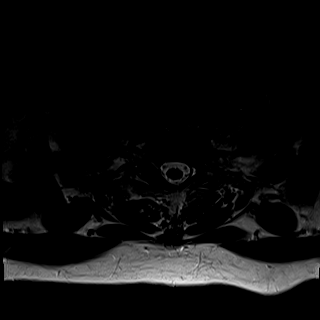
[im 70/70]
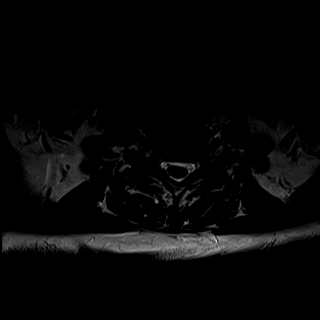

[33 of 48 positions shown; findings below may reference images not displayed]

FINDINGS: Vertebral body height and alignment: Within normal limits.

Marrow signal: The marrow signal is heterogeneous. Mild marrow edema is present in the anterior inferior T7 and anterior superior T8 vertebral bodies. Similarly, mild marrow edema is identified anterior inferior T10 and anterior superior T11 vertebral bodies. 1.2 cm fatty marrow is present anterior inferior L1 vertebral body.

Degenerative disc changes: Anterior bridging osteophytes are present involving the thoracic spine.

Thoracic cord: There is a very small syrinx versus persistent central canal involving T8 and T9 cervical cord ([DATE]).

T1-2: The spinal canal and the neural foramina are patent.

T2-3: The spinal canal and the neural foramina are patent.

T3-4: The spinal canal and the neural foramina are patent.

T4-5: The spinal canal and the neural foramina are patent.

T5-6: The spinal canal and the neural foramina are patent.

T6-7: The spinal canal and the neural foramina are patent.

T7-8: The spinal canal and the neural foramina are patent.

T8-9: The spinal canal and the neural foramina are patent.

T9-10: The spinal canal and the neural foramina are patent.

T10-11: The spinal canal and the neural foramina are patent.

T11-12: The spinal canal and the neural foramina are patent.

T12-L1: The spinal canal and the neural foramina are patent.
IMPRESSION: 1. Syrinx versus a small persistent central canal in the thoracic cord involving T8 and T9 levels. Recommend MRI thoracic spine with contrast to exclude subtle enhancing thoracic cord lesion.

2. Heterogeneous marrow signal described as above.

3. Small endplate marrow edema involving the anterior T7-8 and T10-11 levels. Degenerative discogenic endplate marrow changes are favored. 

4. The thoracic spinal canal and the neural foramina are patent.

RECOMMENDATION:

MRI thoracic spine with contrast evaluation to exclude subtle enhancing cord lesion.

## 2020-08-07 IMAGING — MR MRI CSPINE WO CONTRAST
4 series · 31 of 48 positions shown · non-contrast
Comparison: Plain radiograph of cervical spine dated same day.

HISTORY: 74-year-old Female with radiculopathy, cervical region.
TECHNIQUE: MRI study of the cervical spine was performed with sagittal and axial images in varying sequences.

[Series 5: t2_sag · sagittal · 3.0mm · 0.57mm/px · 11 of 18 slices shown]
[im 1/18]
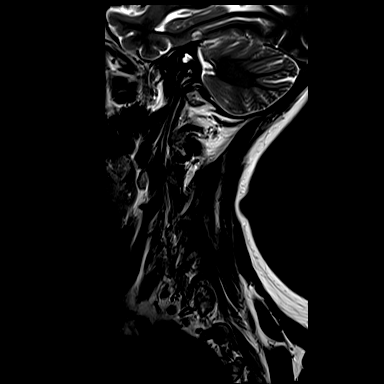
[im 2/18]
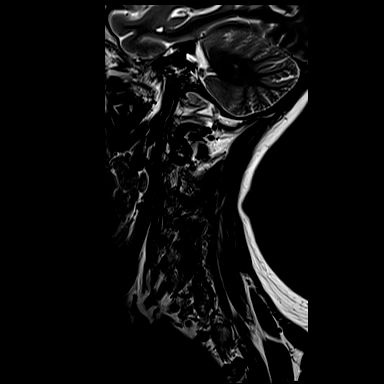
[im 4/18]
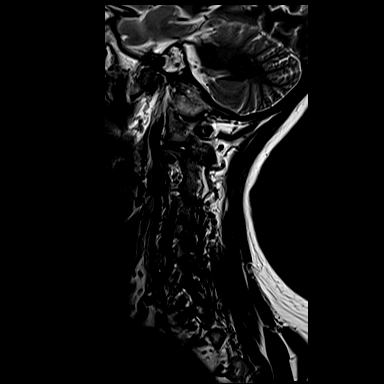
[im 6/18]
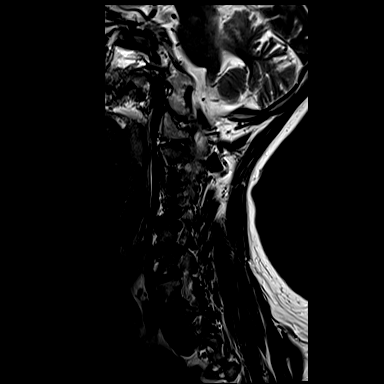
[im 7/18]
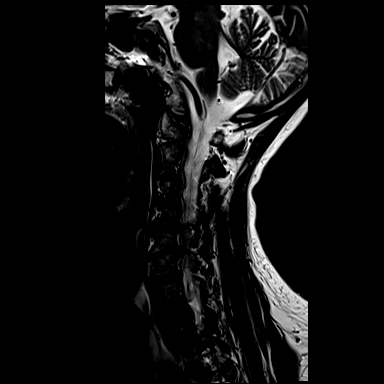
[im 9/18]
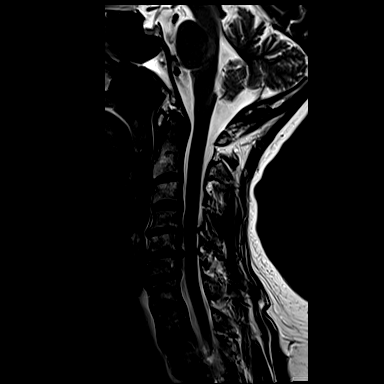
[im 11/18]
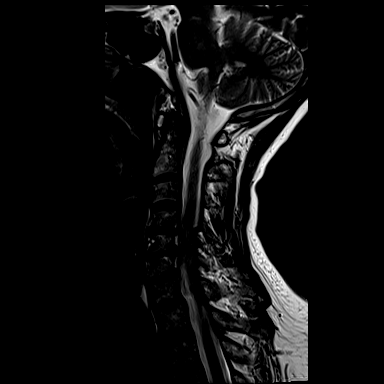
[im 12/18]
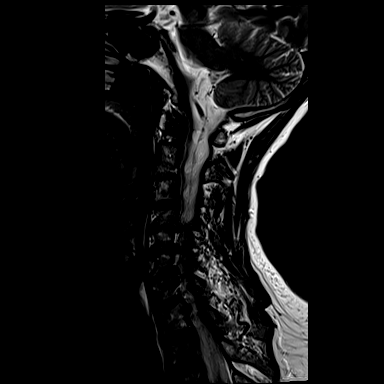
[im 14/18]
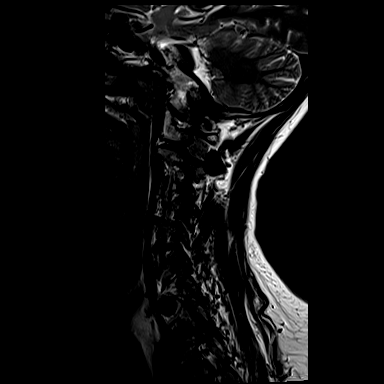
[im 16/18]
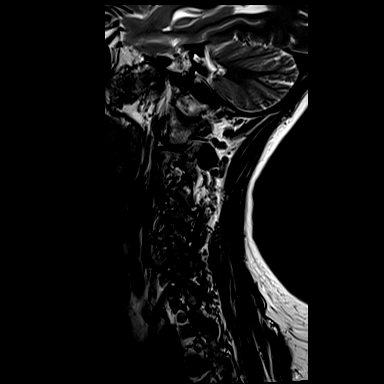
[im 18/18]
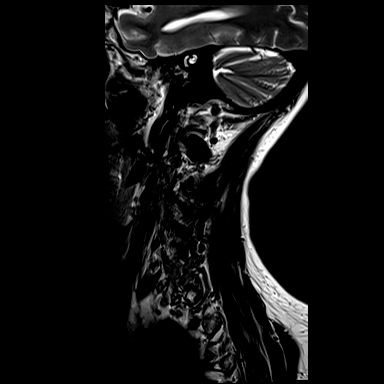

[Series 6: t1_sag · sagittal · 3.0mm · 0.69mm/px · 8 of 18 slices shown]
[im 1/18]
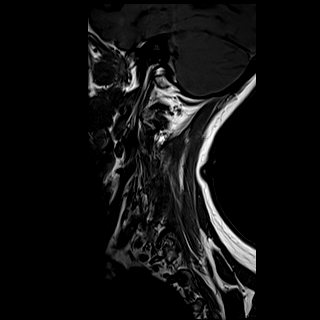
[im 2/18]
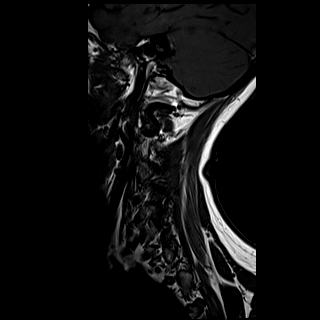
[im 6/18]
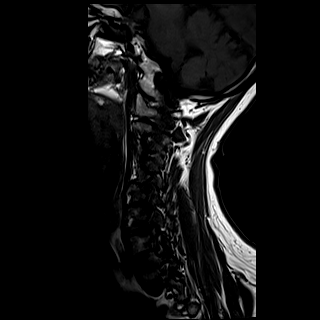
[im 8/18]
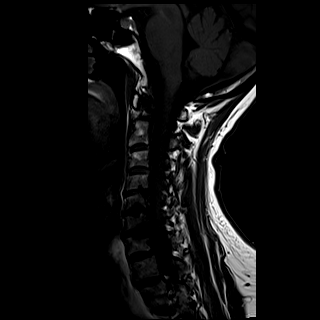
[im 10/18]
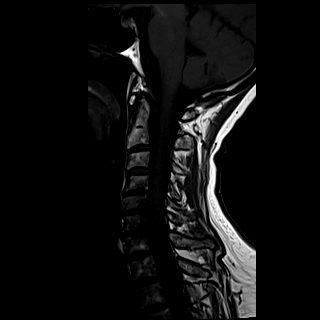
[im 12/18]
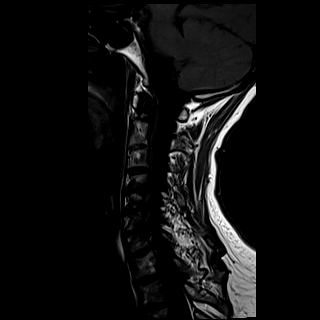
[im 16/18]
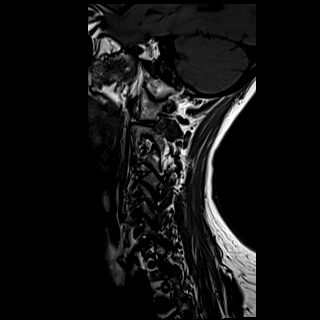
[im 18/18]
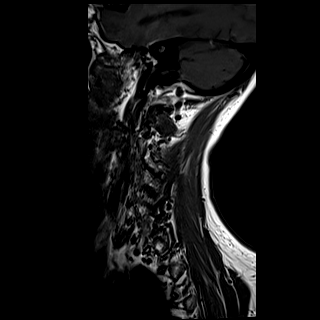

[Series 7: stir_sag · sagittal · 3.0mm · 0.43mm/px · 8 of 18 slices shown]
[im 1/18]
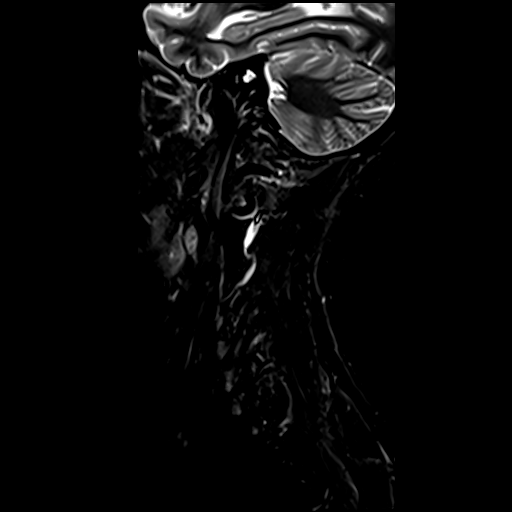
[im 2/18]
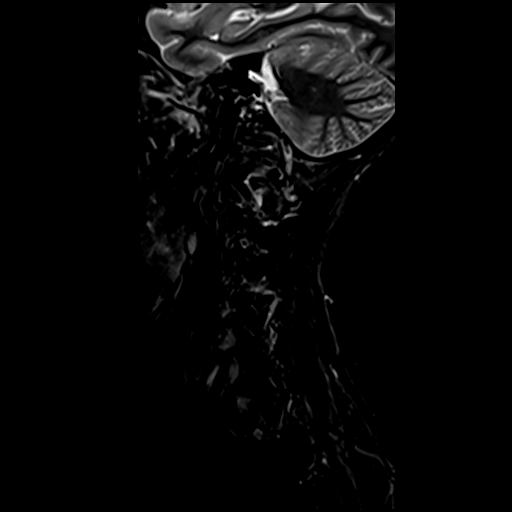
[im 6/18]
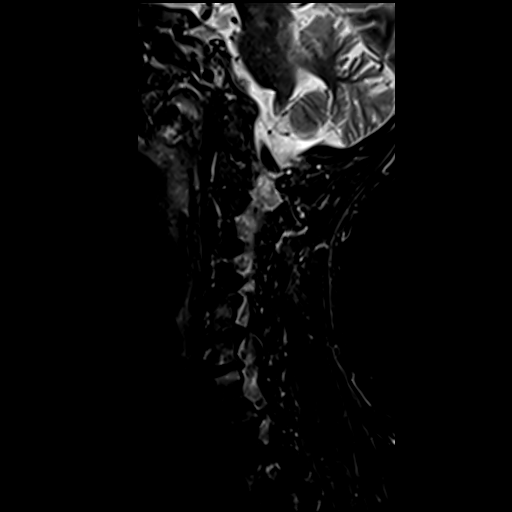
[im 8/18]
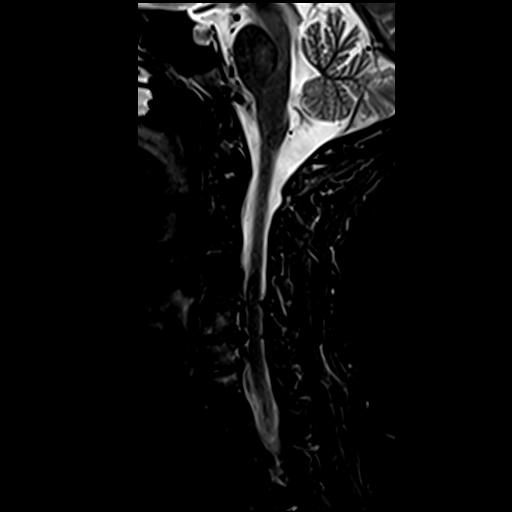
[im 10/18]
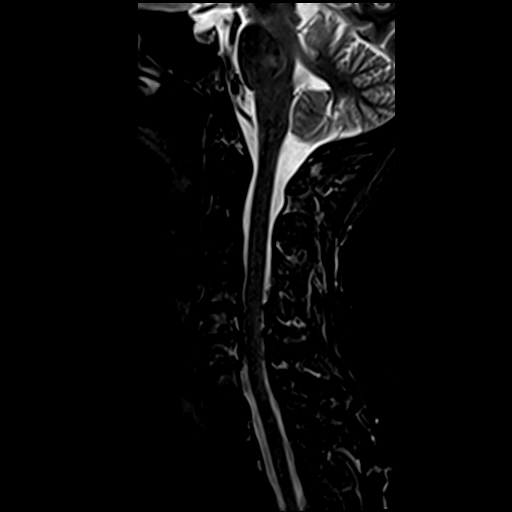
[im 12/18]
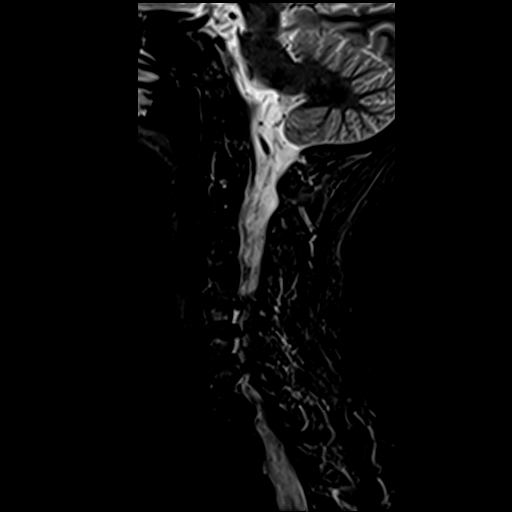
[im 16/18]
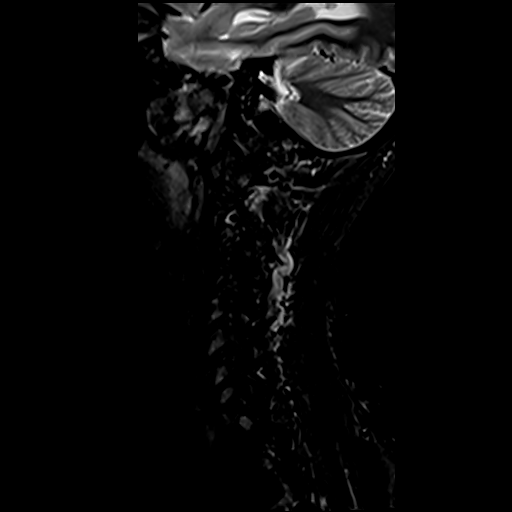
[im 18/18]
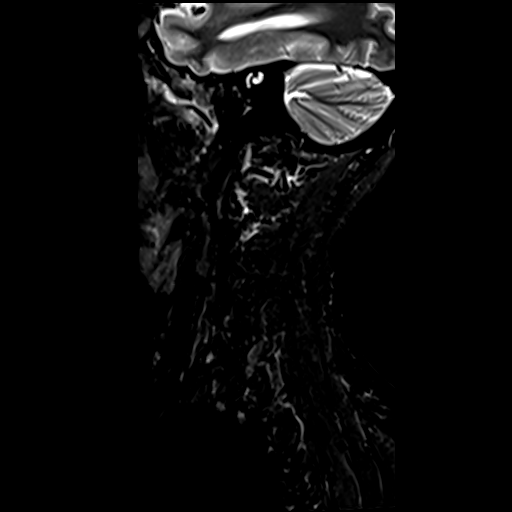

[Series 8: t2_axial · axial · 3.0mm · 0.56mm/px · z∈[-77,+0]mm · 4 of 30 slices shown]
[im 2/30]
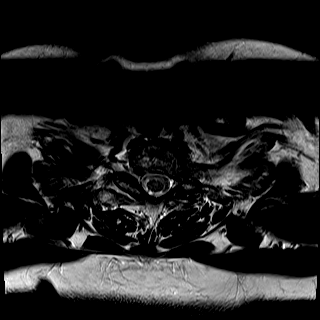
[im 4/30]
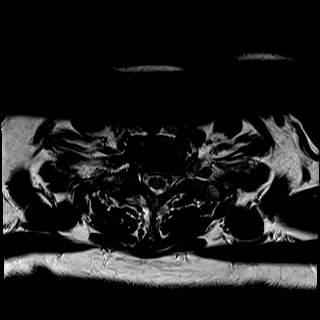
[im 15/30]
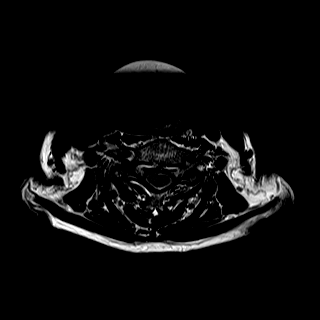
[im 26/30]
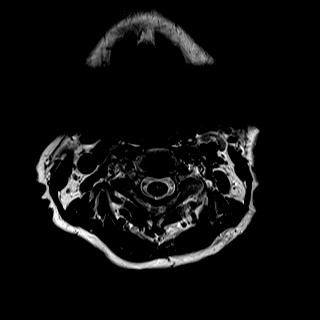

[31 of 48 positions shown; findings below may reference images not displayed]

FINDINGS: Vertebral body height and alignment: The vertebral body height and alignment are within normal limits.

Marrow signal: Mild marrow edema is identified C4, C5, C6 levels.

Degenerative disc changes: Identified C4-5, C5-6, C6-7 levels.

Visualized portion of the posterior fossa and the cervical cord demonstrate normal signal.

C2-3 level:  No spinal canal stenosis. Neural foramina are patent.

C3-4 level: No spinal canal stenosis. Neural foramina are patent.

C4-5 level: Disc bulge. Right uncovertebral arthrosis. Mild spinal canal stenosis. Patent left neural foramen. Moderate right neural foraminal narrowing.

C5-6 level: Disc protrusion with mild spinal canal stenosis. Mild left neural foraminal narrowing. Moderate to severe right neural foramen narrowing.

C6-7 level: Disc bulge. The spinal canal and the neural foramina are patent.

C7-T1 level: No spinal canal stenosis. Neural foramina are patent.
IMPRESSION: 1. Degenerative disc and degenerative discogenic endplate marrow changes identified C4-5, C5-6 and C6-7 levels.

2. Mild C4-5 spinal canal stenosis and moderate to severe right neural foraminal narrowing.

3. Mild C5-6 spinal stenosis with moderate right and mild left neural foraminal narrowings.

## 2020-08-07 IMAGING — CR PELVIS 1-2 VWS
1 series · 1 of 1 positions shown · non-contrast
Comparison: none

HISTORY: 74-year-old female, pelvic pain.
TECHNIQUE: Pelvis x-ray, single view.

[ap]
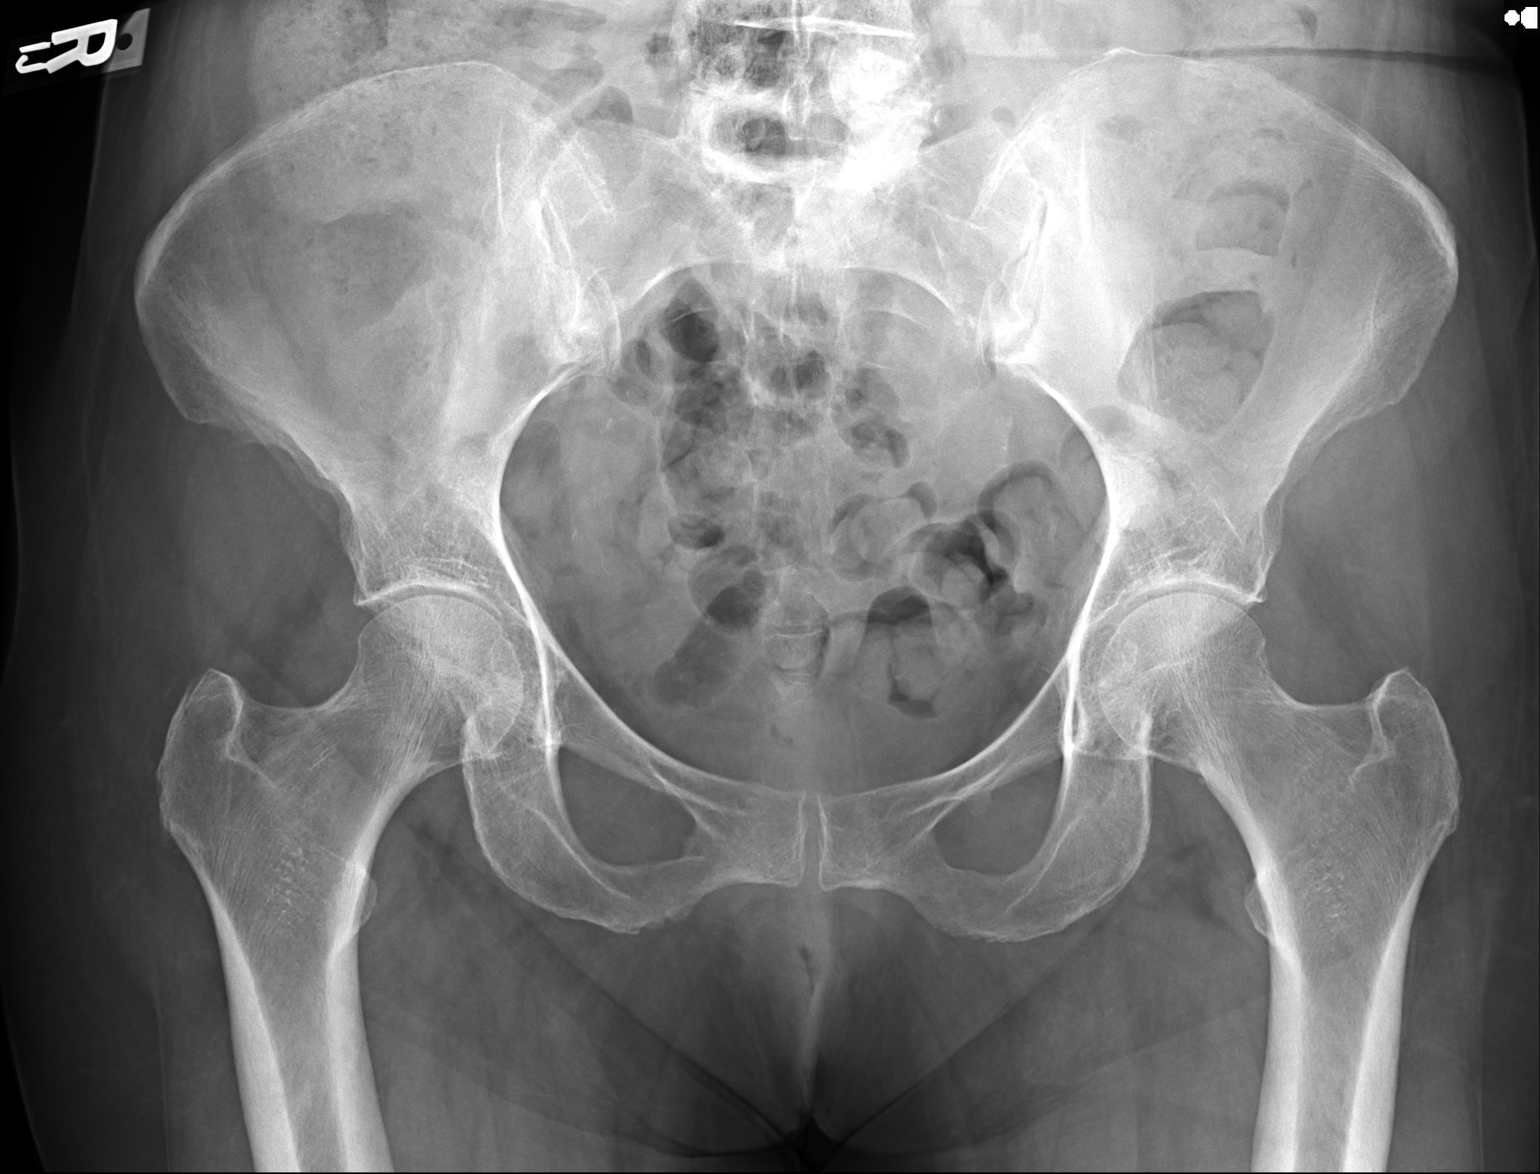

[1 of 1 positions shown; findings below may reference images not displayed]

FINDINGS: Bones are demineralized commensurate with age. Hip joint space is narrowed bilaterally with mild spurring. Sacroiliac joints not fused. No destructive process.
IMPRESSION: Joint space narrowing of both hips with mild superimposed osteoarthritis. No fracture or destructive lesion.

## 2020-08-07 IMAGING — MR MRI LSPINE WO CONTRAST
5 of 6 series · 31 of 48 positions shown · non-contrast
Comparison: Plain radiography of lumbar spine dated same day.

HISTORY: 74-year-old female with neck and low back pain for several months, radiculopathy.
TECHNIQUE: Multiplanar multisequential MR images were obtained of the lumbar spine without contrast administration.

[Series 12: t2_sag · sagittal · 4.0mm · 0.73mm/px · 5 of 18 slices shown]
[im 1/18]
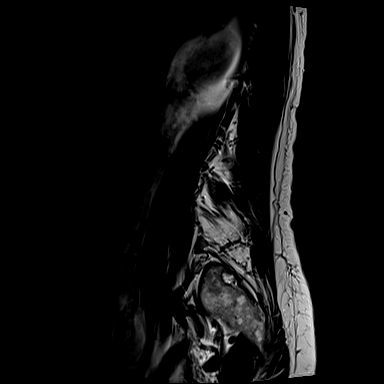
[im 5/18]
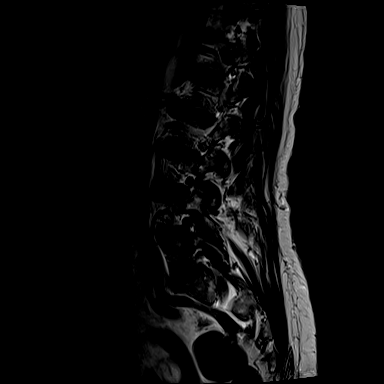
[im 9/18]
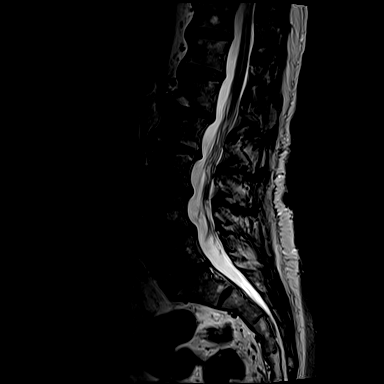
[im 13/18]
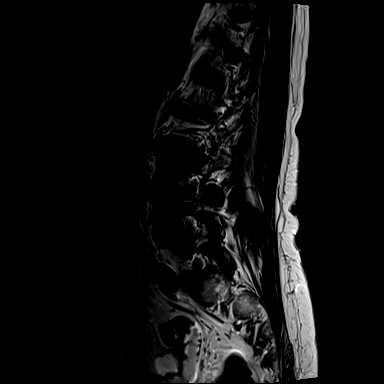
[im 18/18]
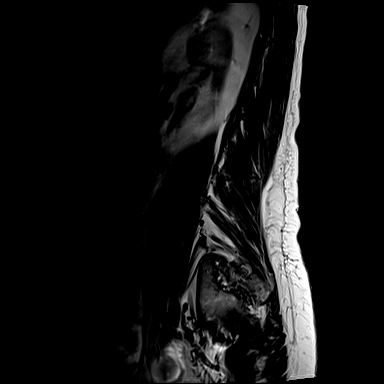

[Series 13: t1_sag · sagittal · 4.0mm · 0.88mm/px · 5 of 18 slices shown]
[im 1/18]
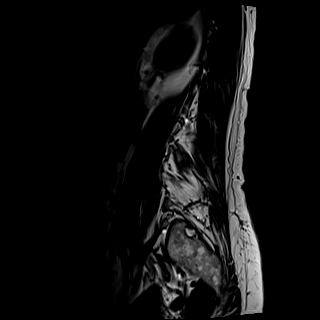
[im 5/18]
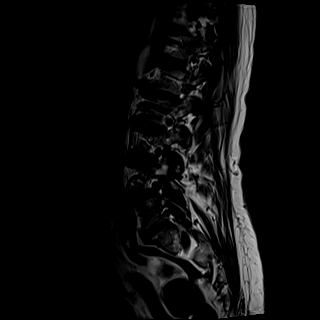
[im 9/18]
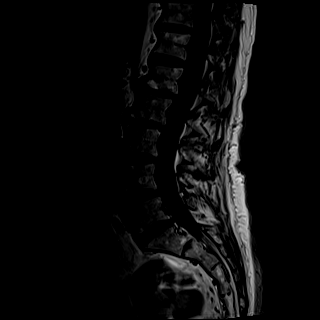
[im 13/18]
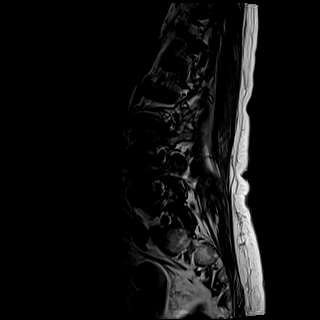
[im 18/18]
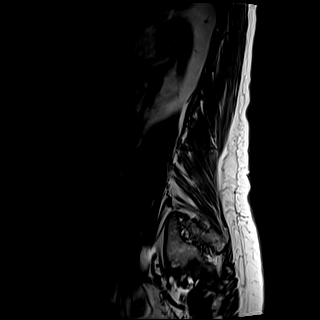

[Series 14: stir_sag · sagittal · 4.0mm · 1.09mm/px · 5 of 18 slices shown]
[im 1/18]
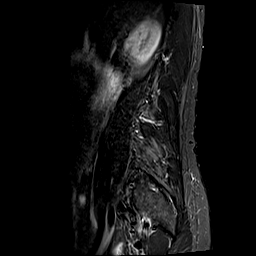
[im 5/18]
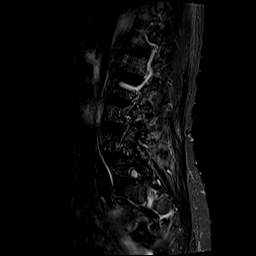
[im 9/18]
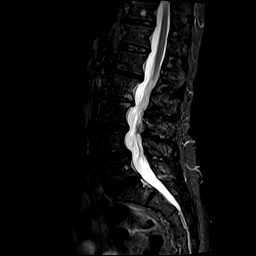
[im 13/18]
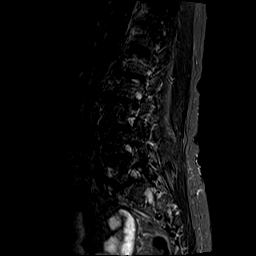
[im 18/18]
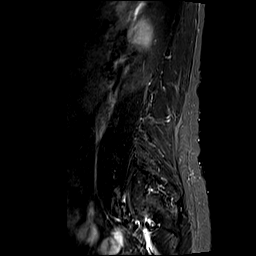

[Series 15: t2_axial · axial · 4.0mm · 0.62mm/px · z∈[-480,-322]mm · 9 of 34 slices shown]
[im 1/34]
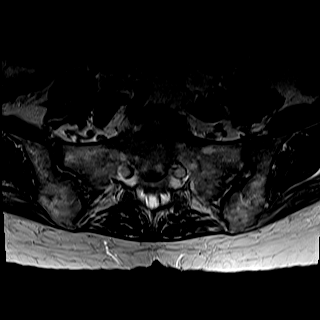
[im 5/34]
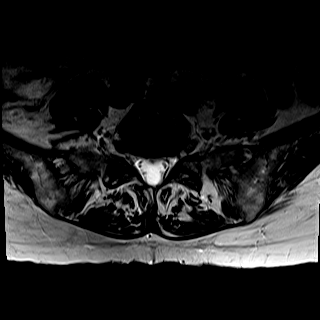
[im 9/34]
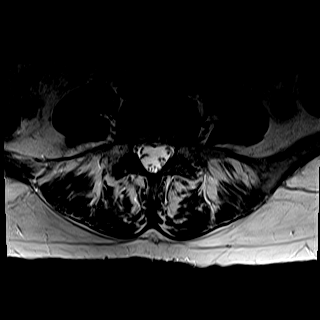
[im 13/34]
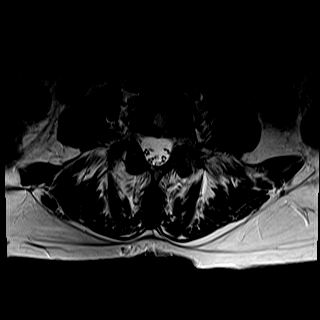
[im 17/34]
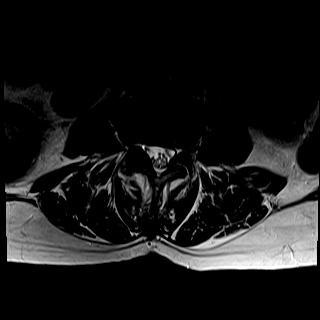
[im 21/34]
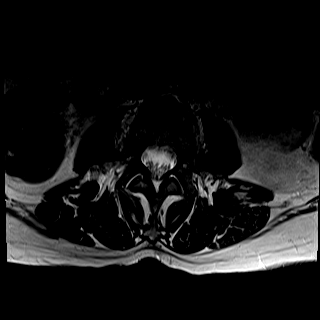
[im 25/34]
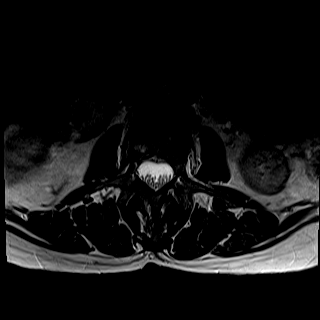
[im 29/34]
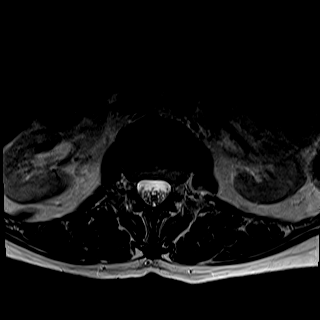
[im 34/34]
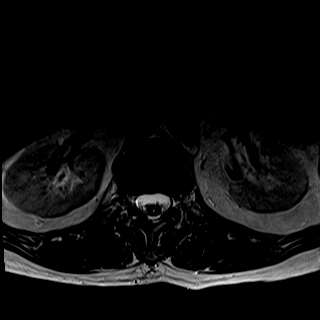

[Series 16: t1_axial_obli · axial · 3.0mm · 0.78mm/px · z∈[-508,-317]mm · 7 of 27 slices shown]
[im 1/27]
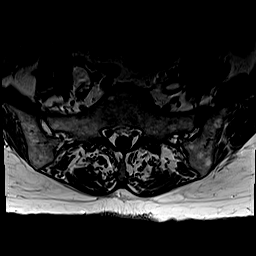
[im 5/27]
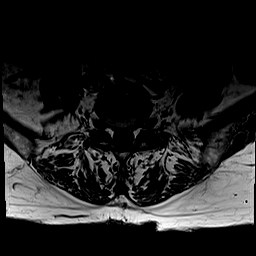
[im 9/27]
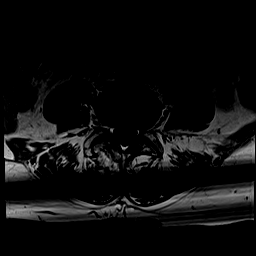
[im 14/27]
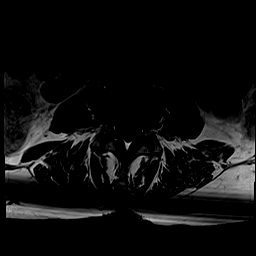
[im 18/27]
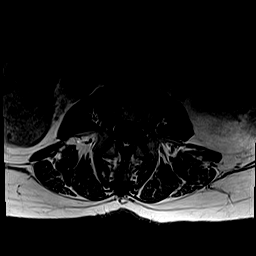
[im 22/27]
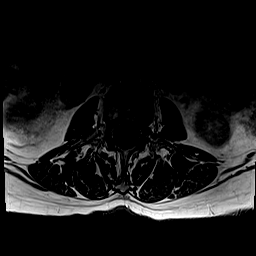
[im 27/27]
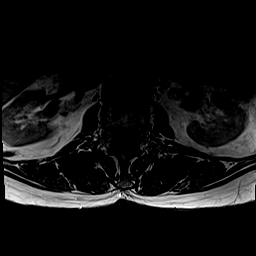

[31 of 48 positions shown; findings below may reference images not displayed]

FINDINGS: Vertebral body height and alignment: Within normal limits.

Degenerative disc changes: Degenerative disc changes are present at the L1-2, L2-3 level.

Conus and cauda equina: Within normal limits.

Marrow signal: Degenerative discogenic endplate marrow edema is identified at the posterior L2-3 level.

L5-S1: Facet arthropathy changes. Disc bulge. Patent spinal canal. Patent bilateral neural foramina.

L4-L5: Disc protrusion and facet arthropathy changes. Patent spinal canal. Mild right and moderate left neural foraminal narrowings.

L3-L4: Disc bulge and facet arthropathy changes. Mild spinal canal stenosis. Patent right neural foramina. Mild left neural foraminal narrowing.

L2-L3: Disc protrusion. Spinal canal and neural foramina are patent.

L1-L2: Disc bulge. The spinal canal and the neural foramina are patent.

T12-L1: No disc protrusion, canal stenosis, or foraminal stenosis.

Visualized SI joints: Intact.

Visualized soft tissues: Unremarkable.
IMPRESSION: 1. Moderate left and mild right neural foraminal narrowings identified L4-5 level.

2. Mild L3-4 spinal canal stenosis and mild left neural foraminal narrowing.

3. Degenerative disc and degenerative discogenic endplate marrow changes are present particularly at the L2-3 level.

## 2020-08-07 IMAGING — CR T-SPINE 2 VWS
1 series · 3 of 3 positions shown · non-contrast
Comparison: MR thoracic spine without contrast, 08/06/20.

HISTORY: Upper back pain.
TECHNIQUE: 3 view thoracic spine.

[Series 1: ap · 0.17mm/px · 3 of 3 slices shown]
[im 1/3]
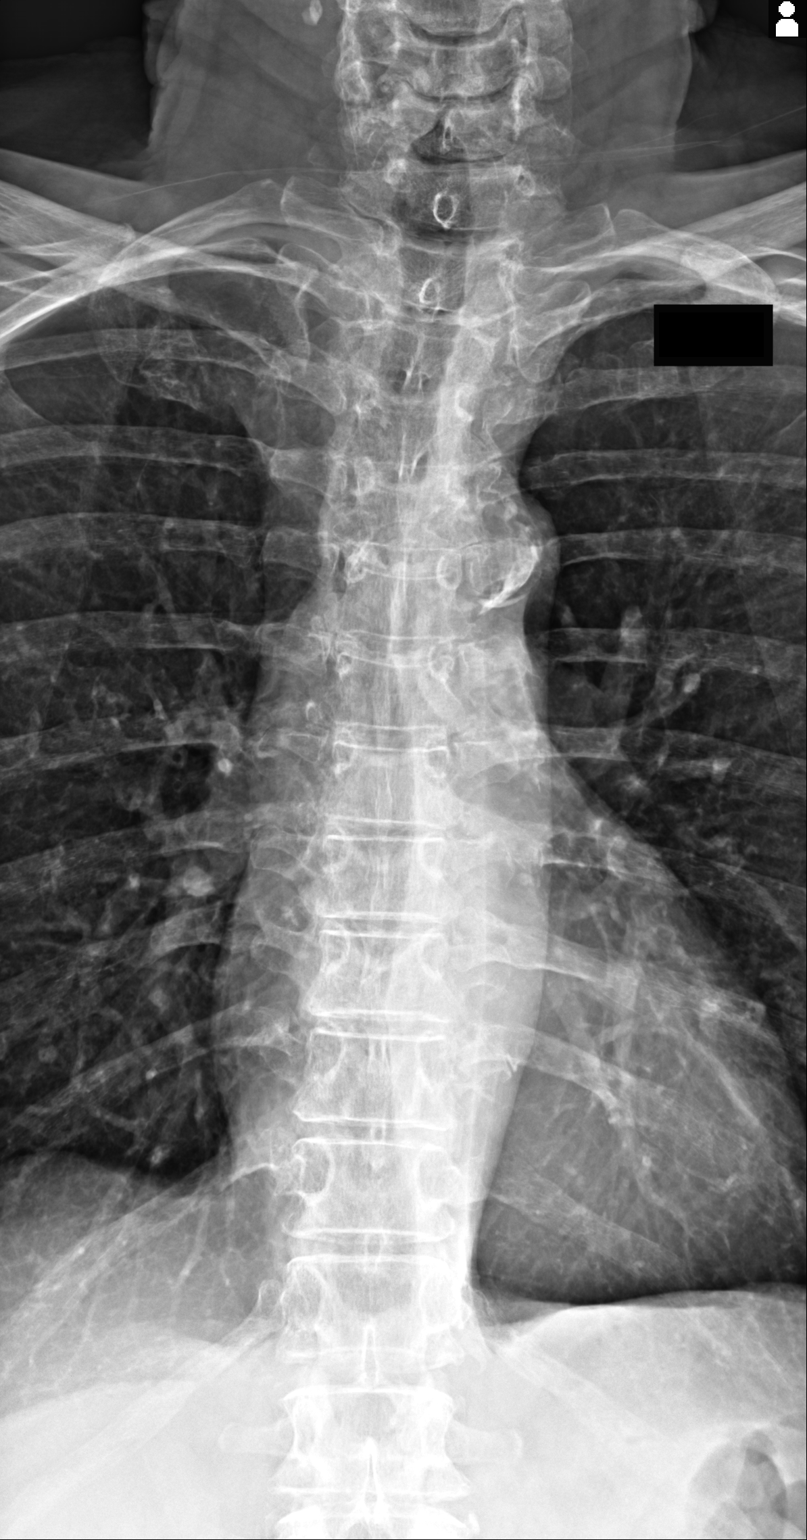
[im 2/3]
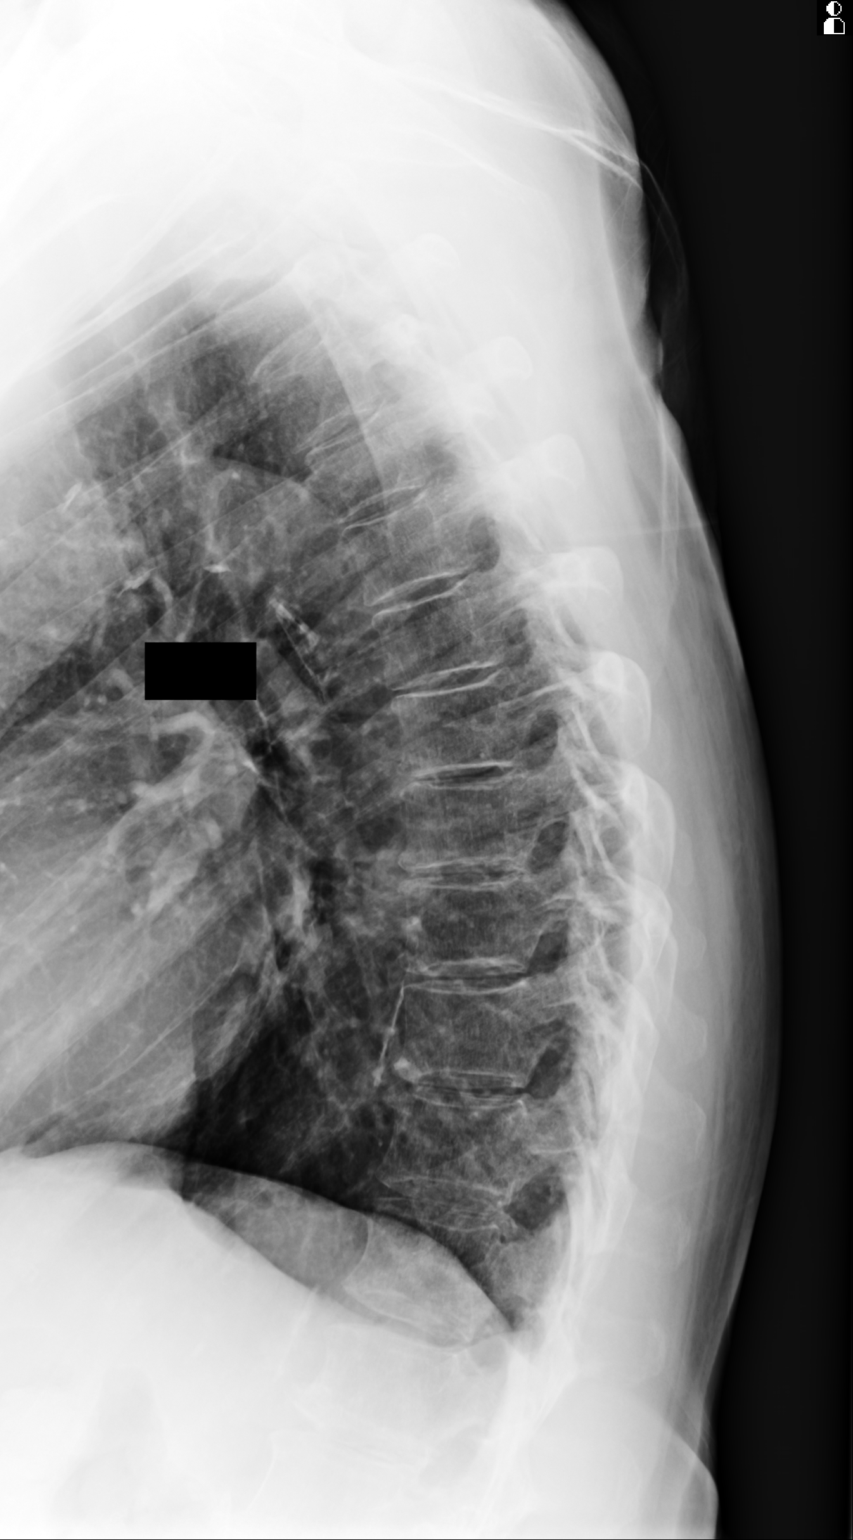
[im 3/3]
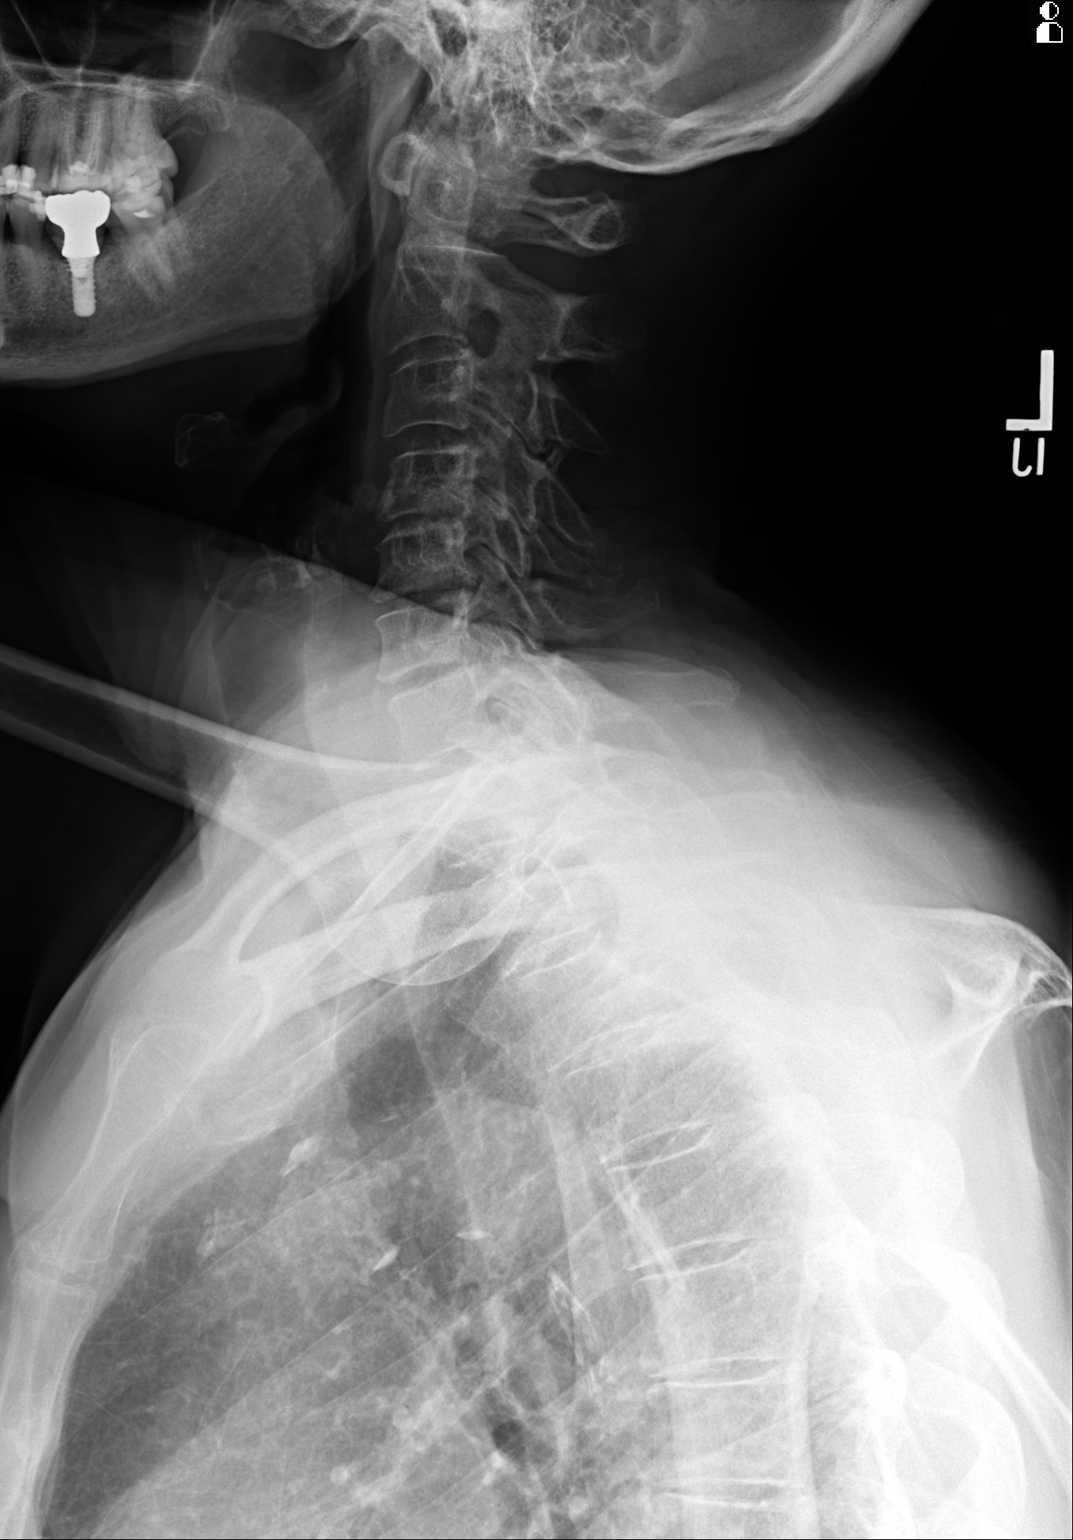

[3 of 3 positions shown; findings below may reference images not displayed]

FINDINGS: There is normal vertebral body height and alignment without fracture or destructive change. All pedicles appear to be intact. There is mild degenerative disc space narrowing without any evidence of discitis.
IMPRESSION: No acute findings in the thoracic spine. Minimal degenerative change.

## 2020-08-07 IMAGING — CR SHOULDER RT 2-3 VWS
1 series · 3 of 3 positions shown · non-contrast
Comparison: None

Right shoulder 3 views

INDICATIONS:  Right shoulder pain

[Series 1: internal rotate · 0.17mm/px · 3 of 3 slices shown]
[im 1/3]
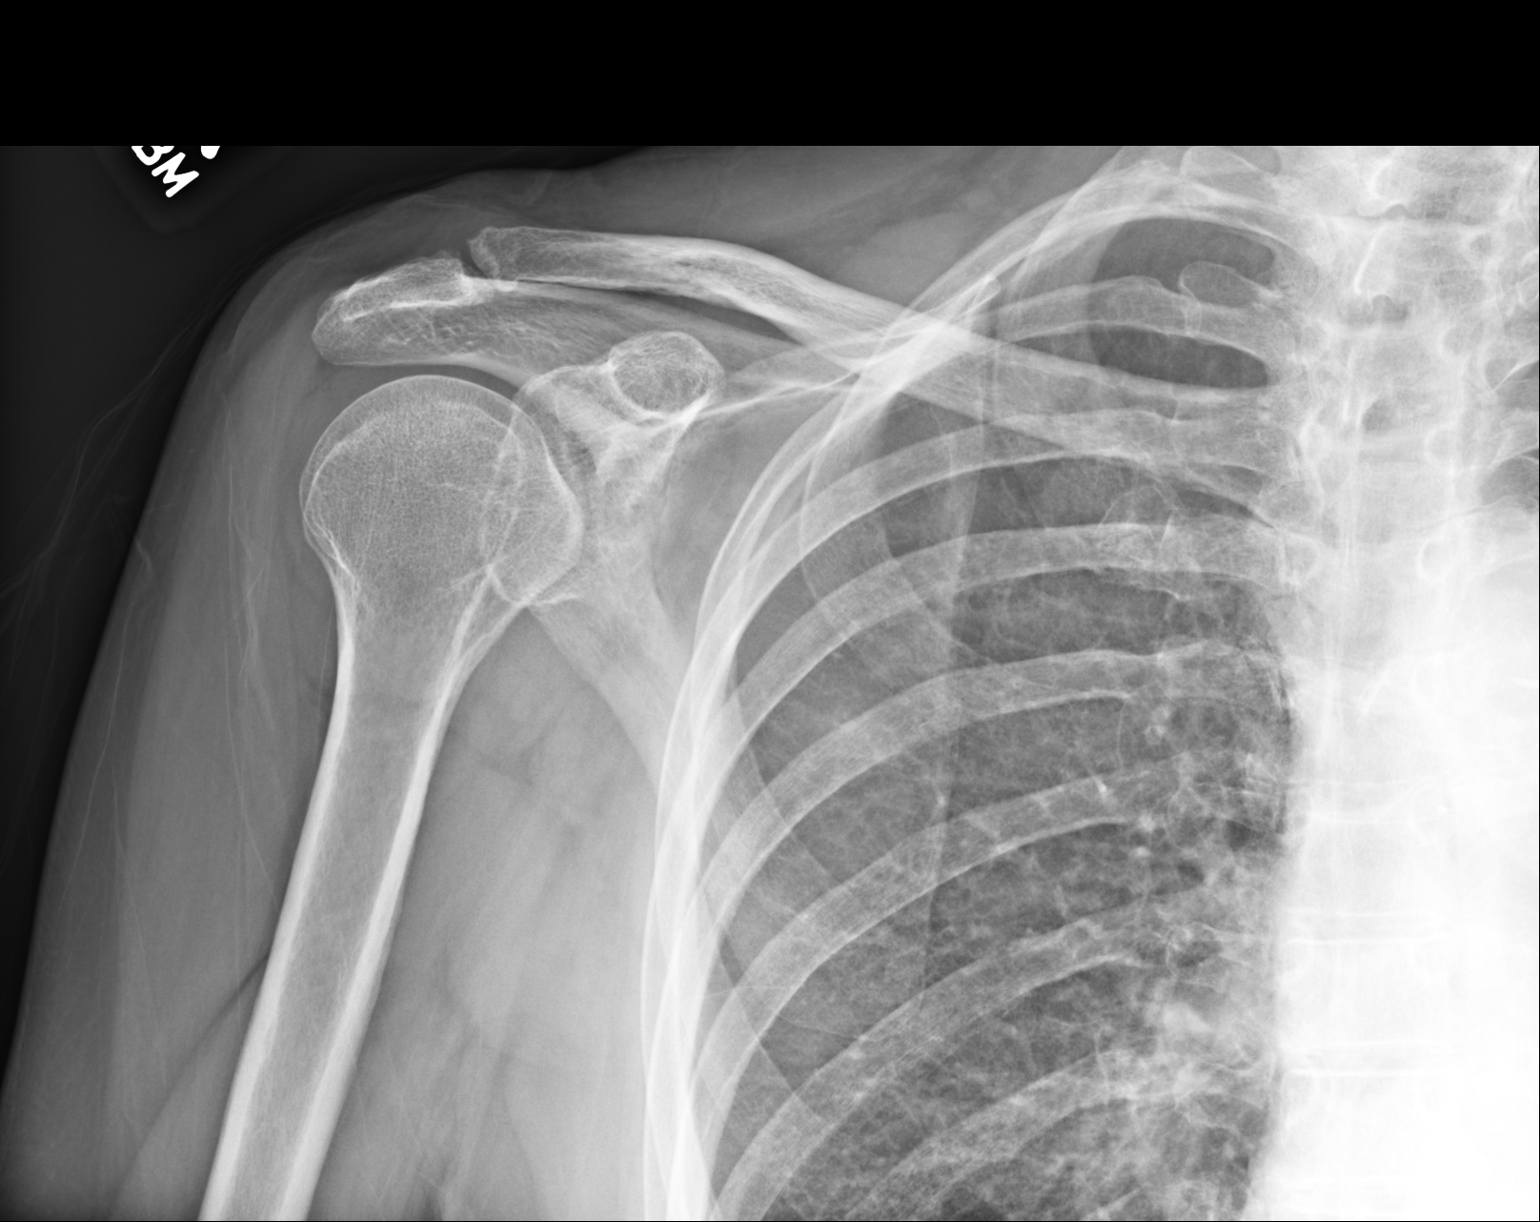
[im 2/3]
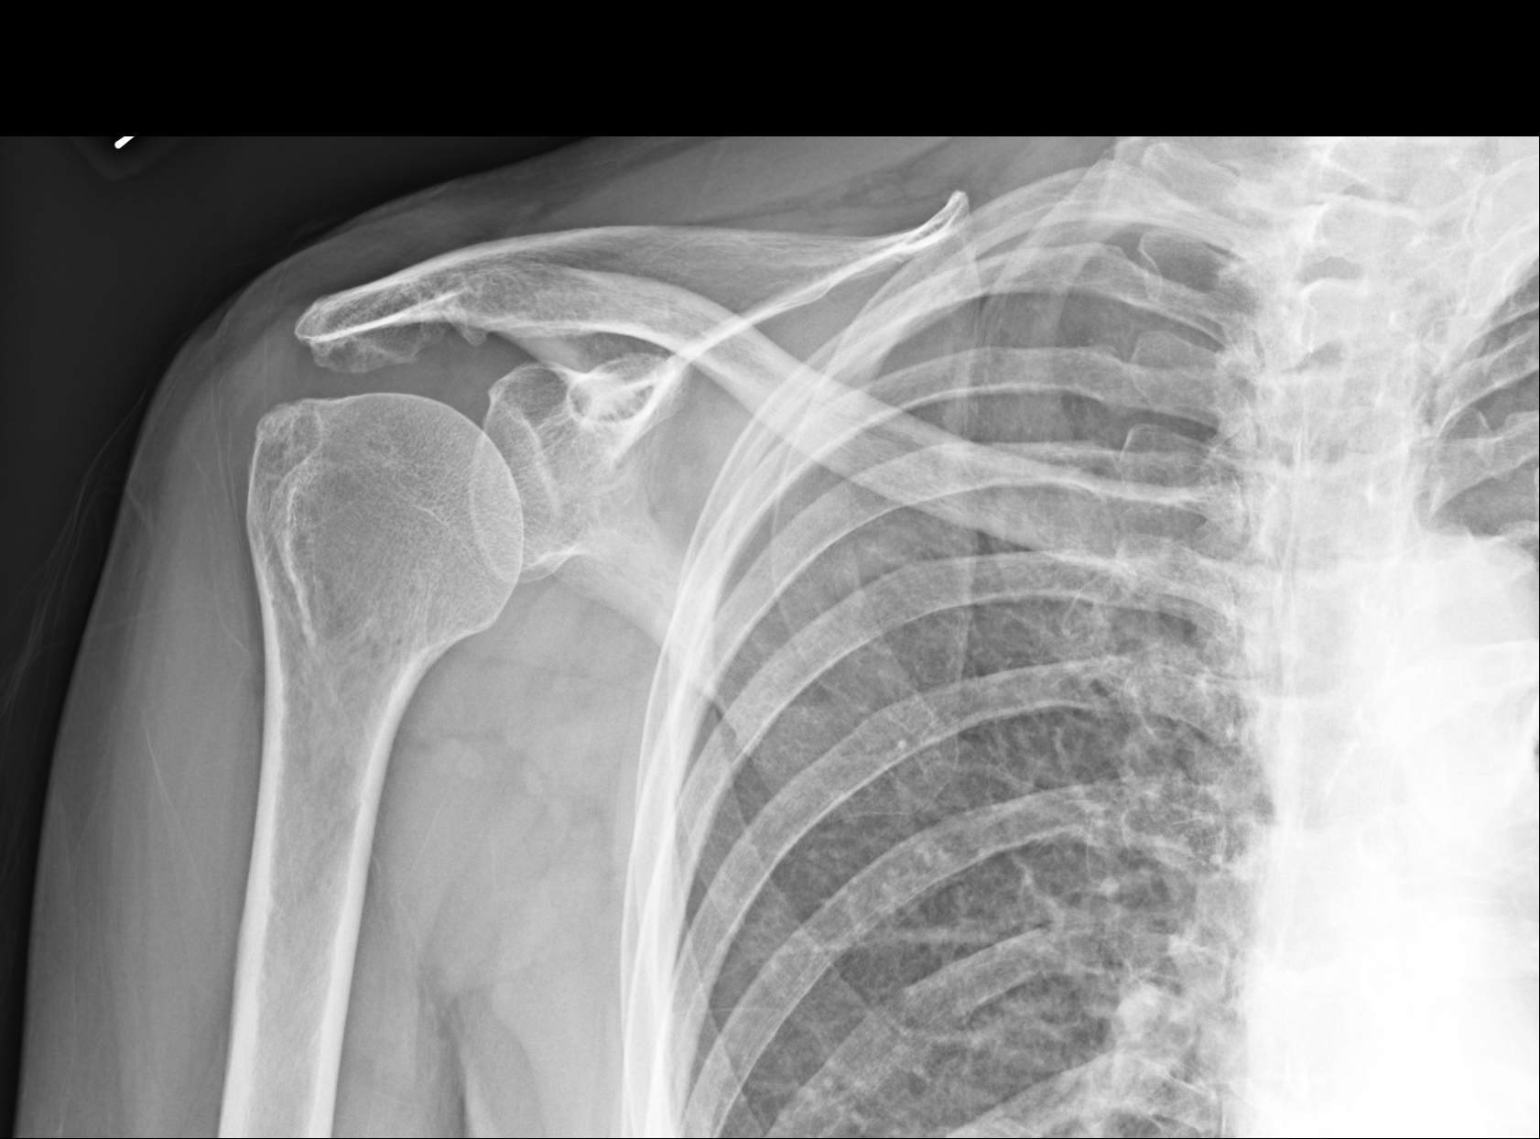
[im 3/3]
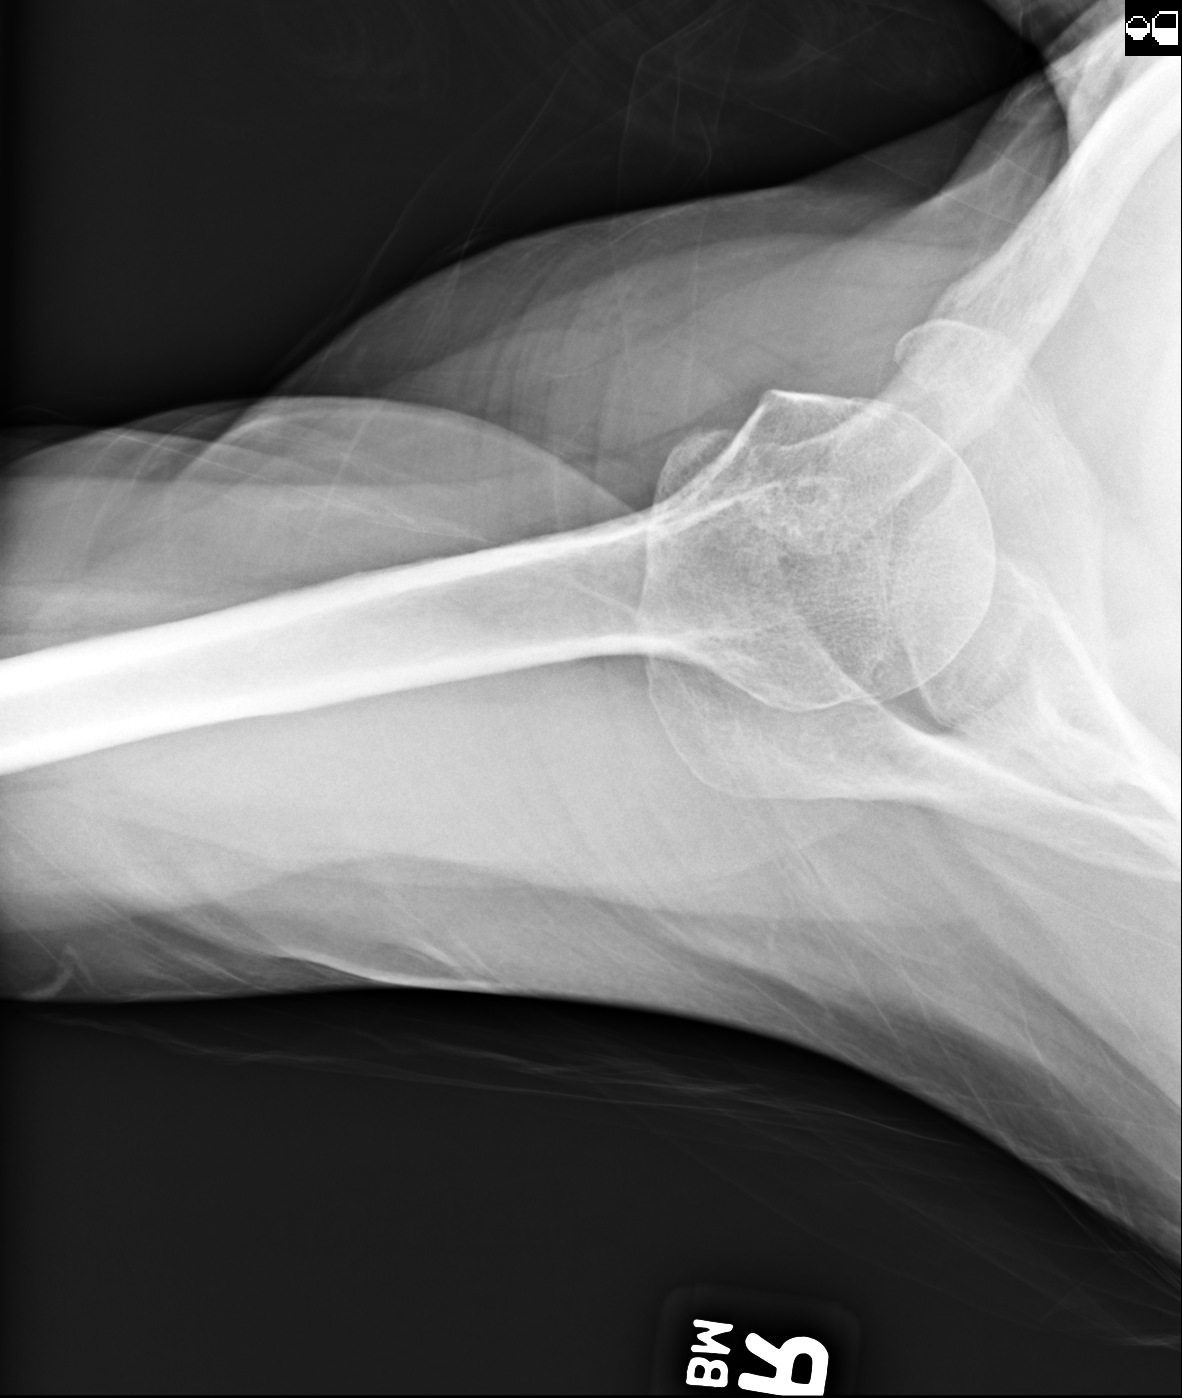

[3 of 3 positions shown; findings below may reference images not displayed]

FINDINGS: Moderate AC joint and mild glenohumeral DJD. No erosion, subluxation or acute process. Bone mineralization looks normal.
IMPRESSION: Right shoulder degenerative changes per above.

## 2020-08-07 IMAGING — CR L-SPINE 2-3 VWS
1 series · 3 of 3 positions shown · non-contrast
Comparison: None.

HISTORY: Low back pain.
TECHNIQUE: Three-view lumbar spine.

[Series 1: ap · 0.17mm/px · 3 of 3 slices shown]
[im 1/3]
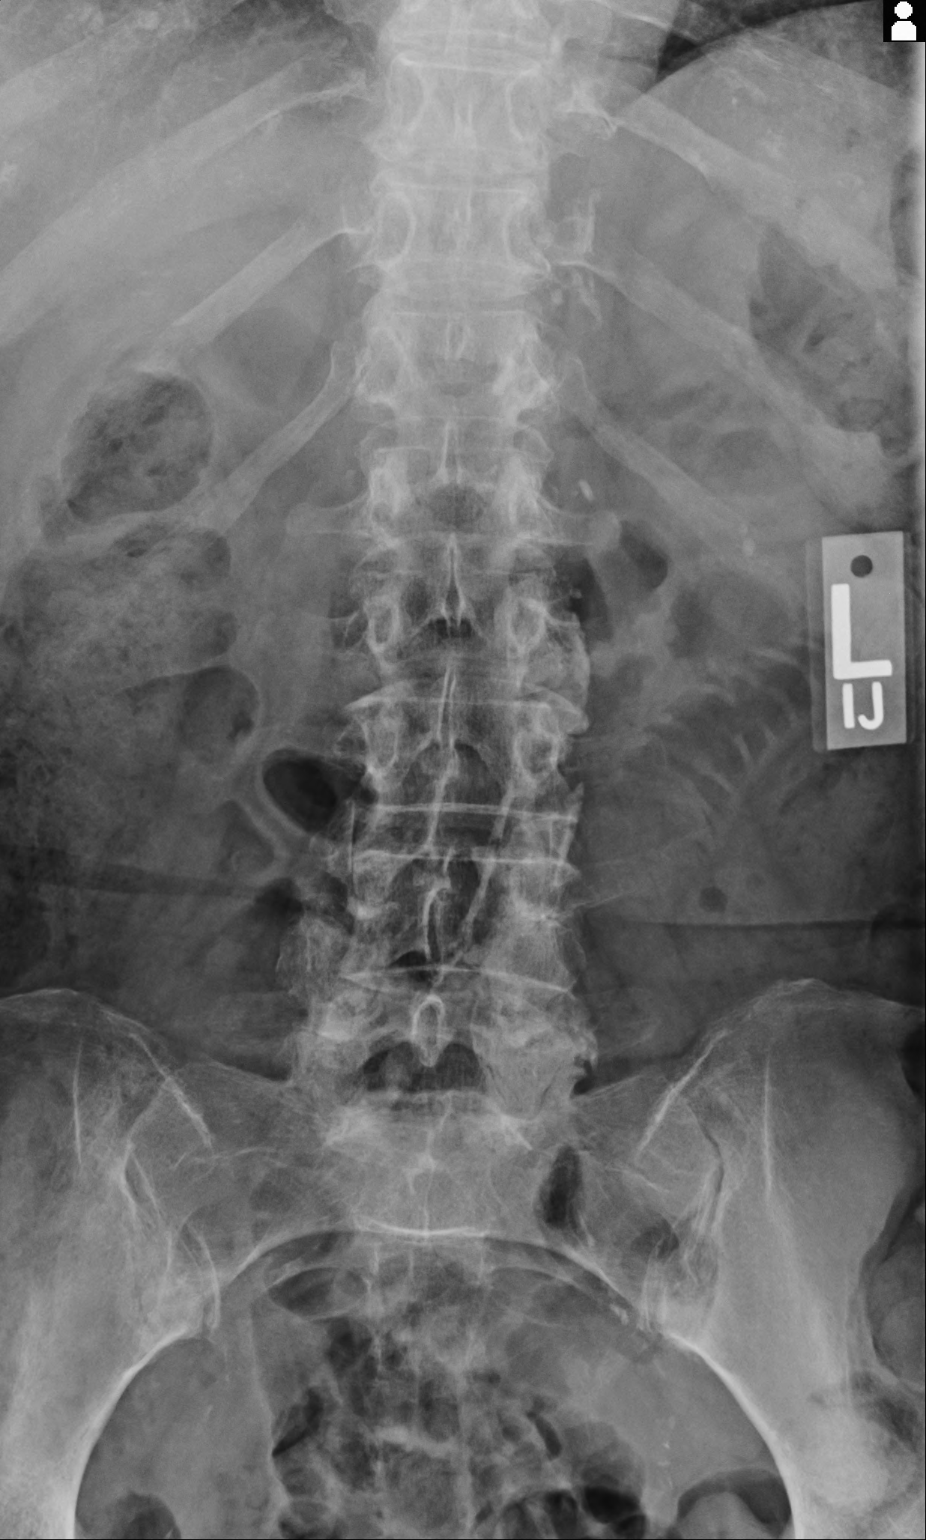
[im 2/3]
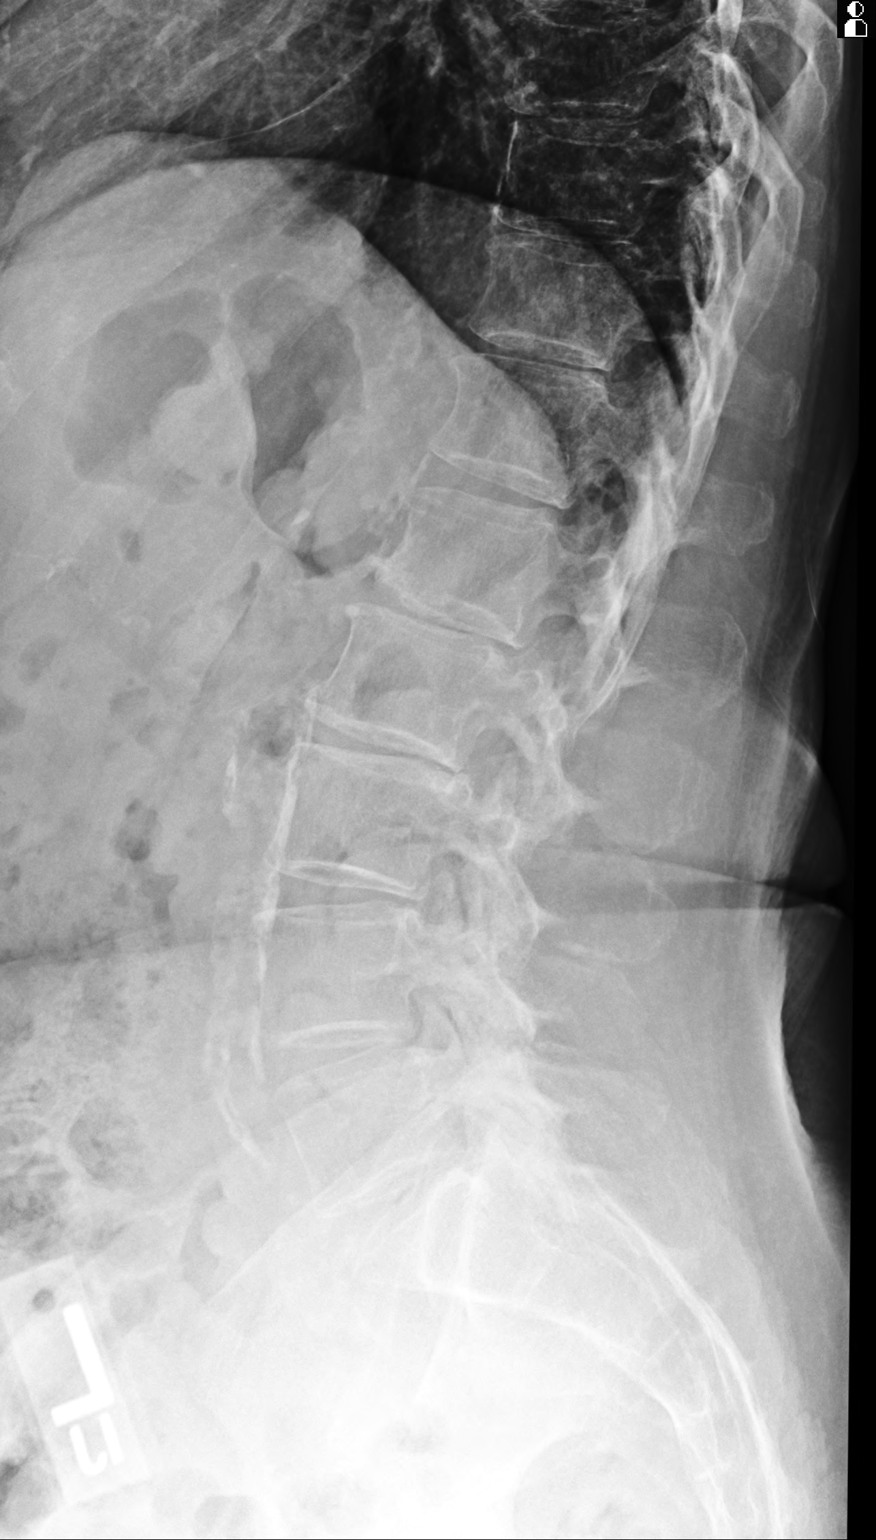
[im 3/3]
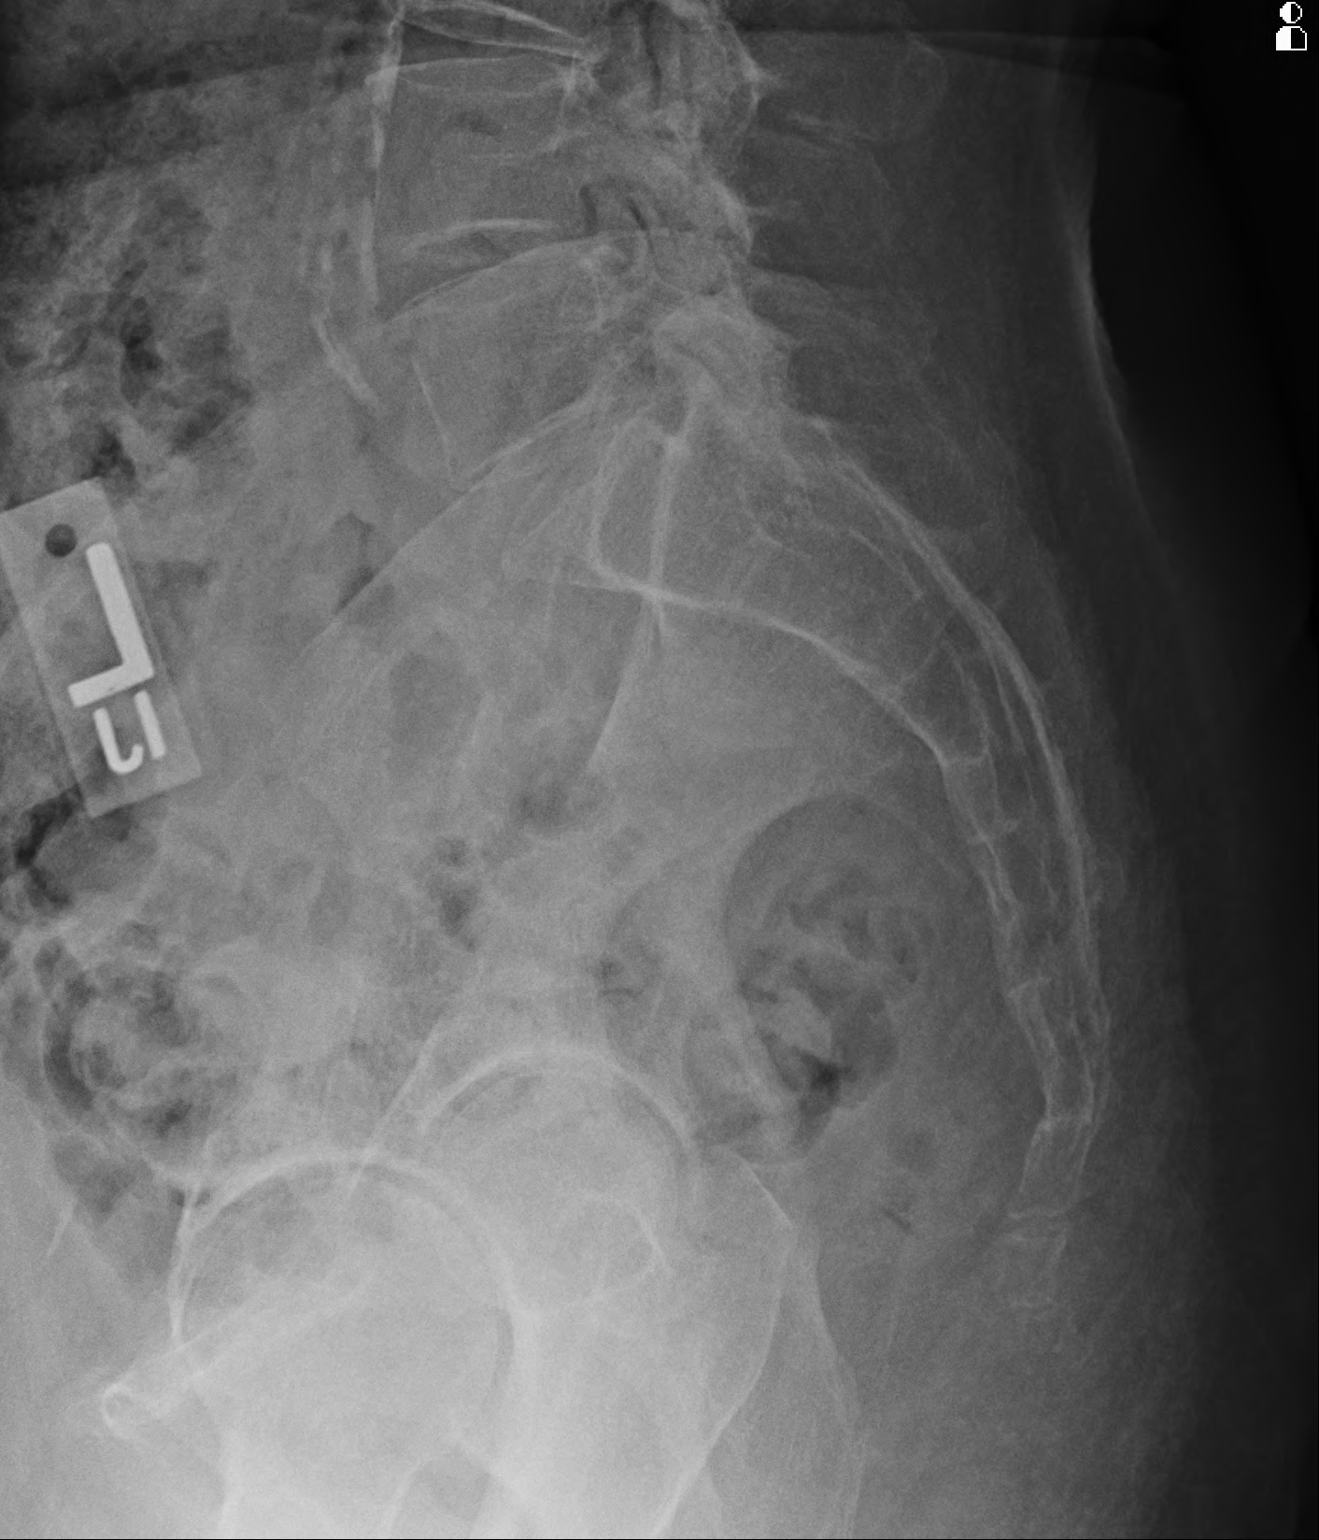

[3 of 3 positions shown; findings below may reference images not displayed]

FINDINGS: There are five lumbar-type vertebrae. There is normal vertebral body height and alignment without spondylolysis or spondylolisthesis. Both sacroiliac joints are open. There is mild sacroiliac degenerative change.

There is mild facet joint arthropathy from about L3-S1 progressively. There is multilevel degenerative disc space narrowing without reactive sclerosis or vacuum disc. There are small marginal osteophytes at most levels.

As an incidental finding, there is atheromatous calcification of the abdominal aorta, but no evidence of aneurysm.
IMPRESSION: 1. No acute finding in the lumbar spine.

2. Multilevel facet joint arthropathy and degenerative disc space narrowing.

## 2020-08-07 IMAGING — CR C-SPINE 2 or 3 views
1 series · 3 of 3 positions shown · non-contrast
Comparison: None.

HISTORY: Cervical radiculopathy. Neck pain.
TECHNIQUE: 3 view cervical spine.

[Series 1: lat · 0.17mm/px · 3 of 3 slices shown]
[im 1/3]
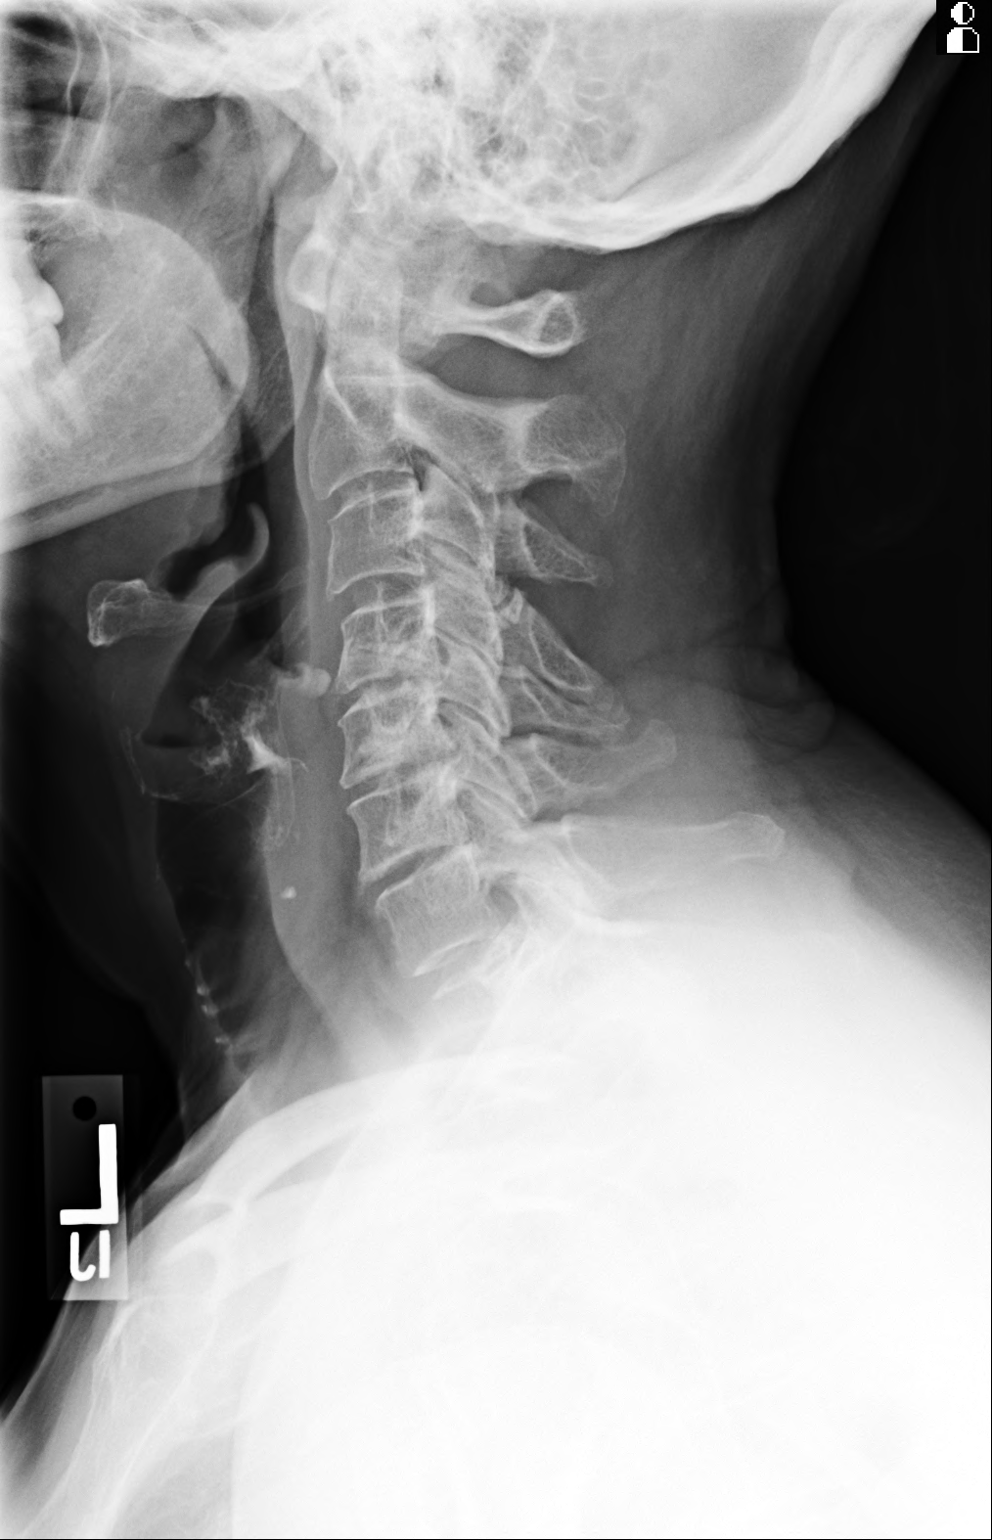
[im 2/3]
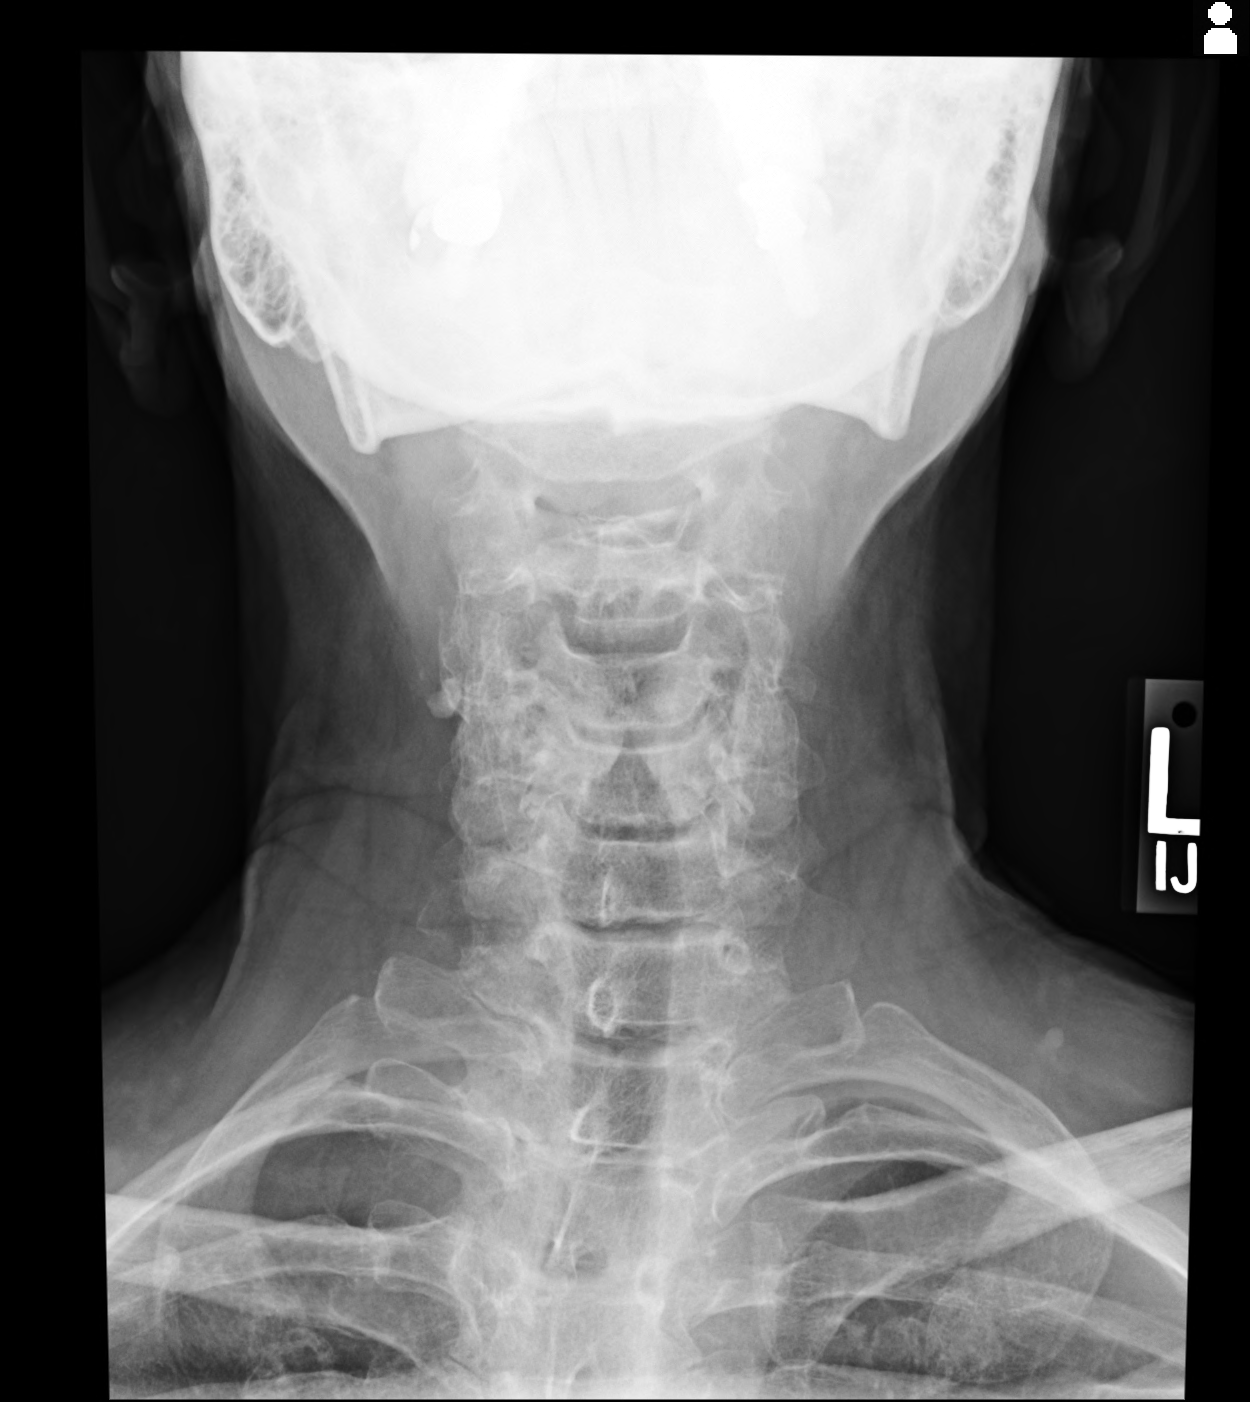
[im 3/3]
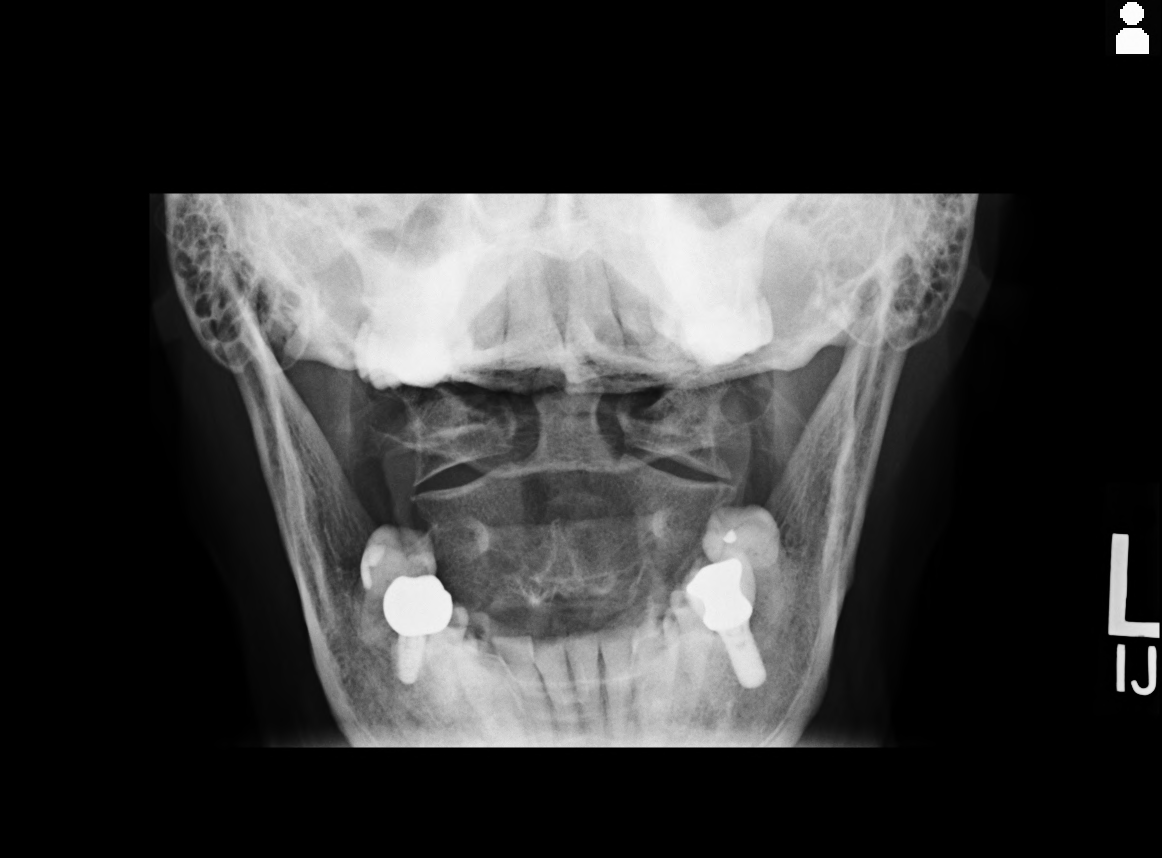

[3 of 3 positions shown; findings below may reference images not displayed]

FINDINGS: On the lateral view of the cervical spine all 7 cervical vertebrae are visible. There is no fracture, subluxation or prevertebral soft tissue swelling. The sagittal diameter of the spinal canal is normal. The lung apices are clear. There are no cervical ribs. C1-C2 is unremarkable.

There is mild diffuse facet joint arthropathy and uncovertebral hypertrophic change. There is multilevel degenerative disc space narrowing from C4 through C7.

As an incidental finding there is calcification in the right carotid bifurcation.
IMPRESSION: 1. No acute findings cervical spine.

2. Multilevel facet joint sclerosis and multilevel disc space narrowing.

## 2020-08-08 IMAGING — MR MRI SHOULDER RT WO CONTRAST
4 of 5 series · 29 of 40 positions shown · non-contrast
Comparison: Right shoulder radiographs 08/07/2020

INDICATION: Pain in right shoulder.
TECHNIQUE: Multiplanar, multiecho imaging of the right shoulder was performed, including T1-weighted and fluid sensitive sequences without intravenous contrast.

[Series 6: t2_cor_obl_fs · coronal · right · 3.0mm · 0.44mm/px · 5 of 17 slices shown]
[im 1/17]
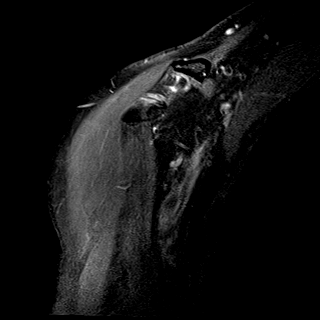
[im 5/17]
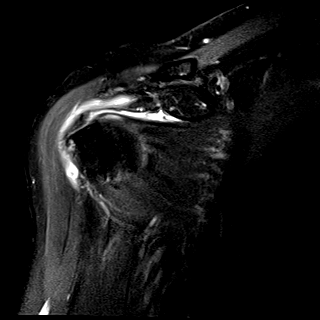
[im 9/17]
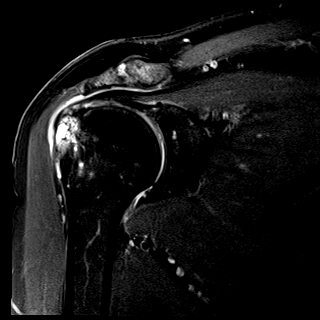
[im 13/17]
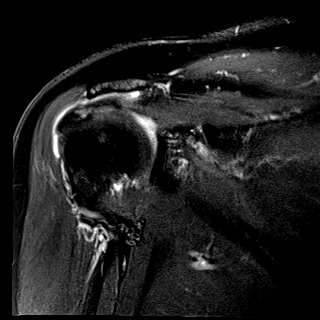
[im 17/17]
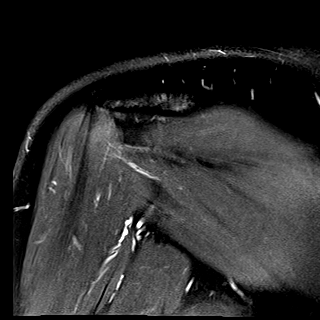

[Series 8: t1_sag_obl · sagittal · right · 3.0mm · 0.36mm/px · 9 of 28 slices shown]
[im 1/28]
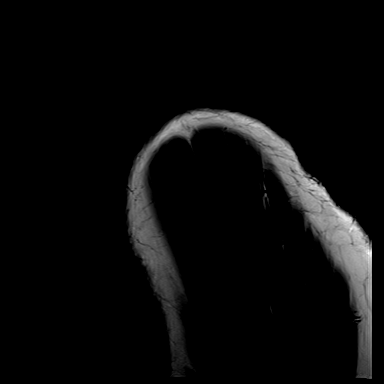
[im 4/28]
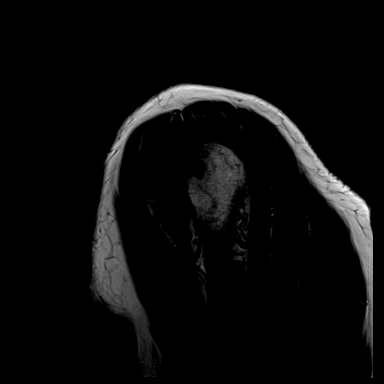
[im 7/28]
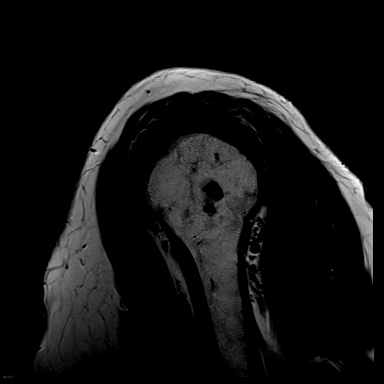
[im 11/28]
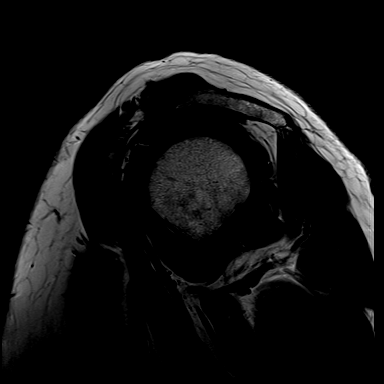
[im 14/28]
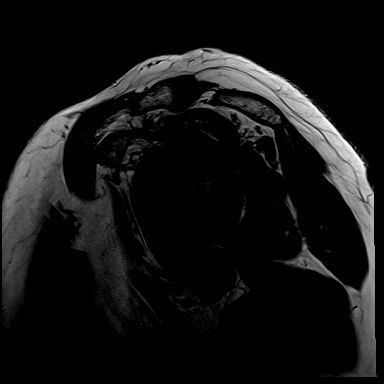
[im 17/28]
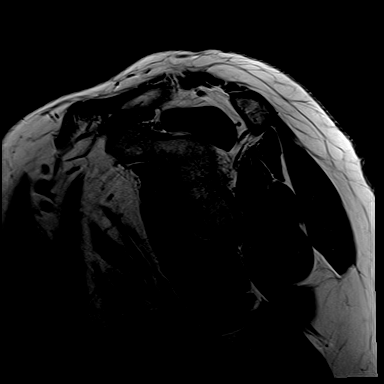
[im 21/28]
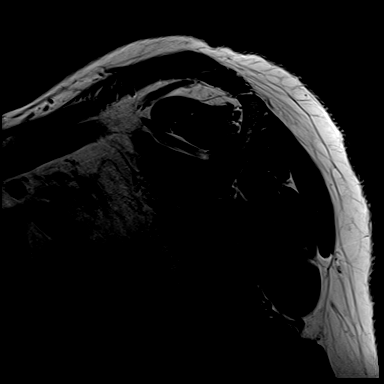
[im 24/28]
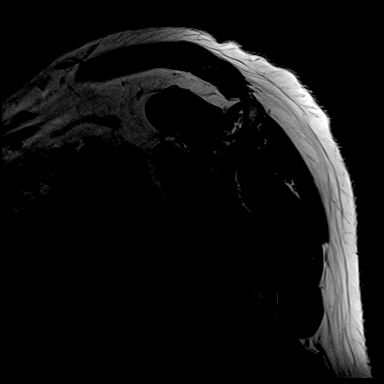
[im 28/28]
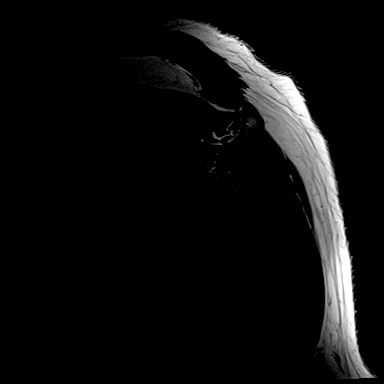

[Series 9: t2_axial_fs · axial · right · 3.0mm · 0.47mm/px · z∈[-22,+52]mm · 6 of 20 slices shown]
[im 1/20]
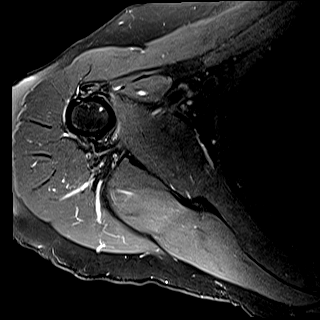
[im 4/20]
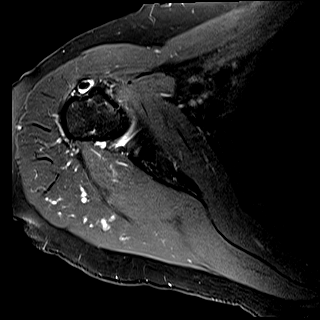
[im 8/20]
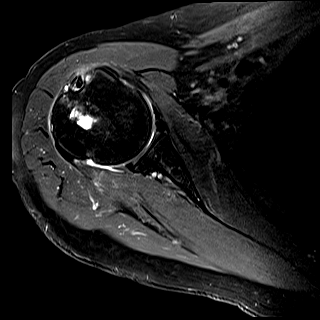
[im 12/20]
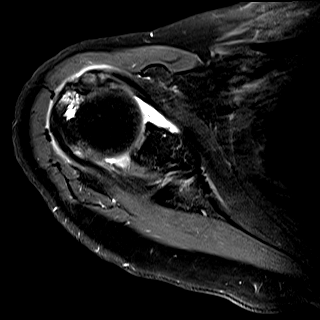
[im 16/20]
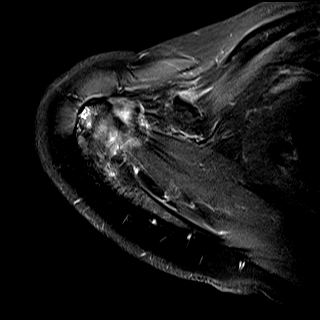
[im 20/20]
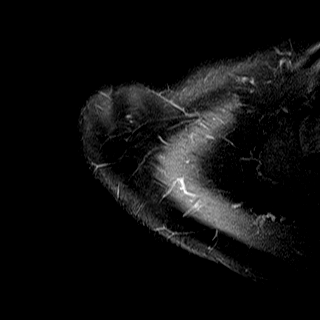

[Series 10: t2_sag_obl_fs · sagittal · right · 3.0mm · 0.44mm/px · 9 of 28 slices shown]
[im 1/28]
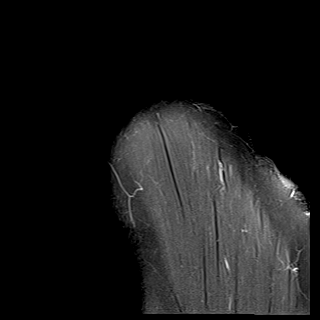
[im 4/28]
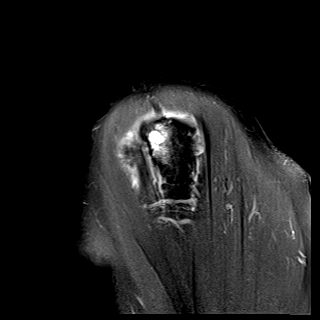
[im 7/28]
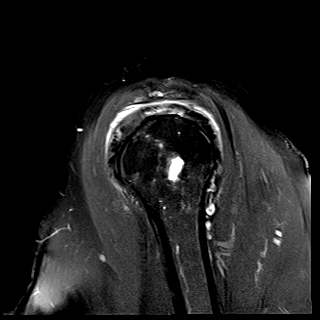
[im 11/28]
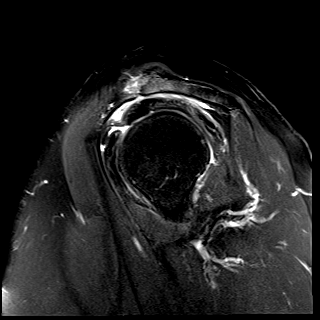
[im 14/28]
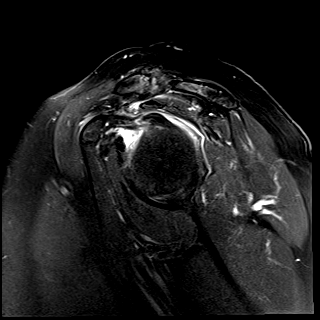
[im 17/28]
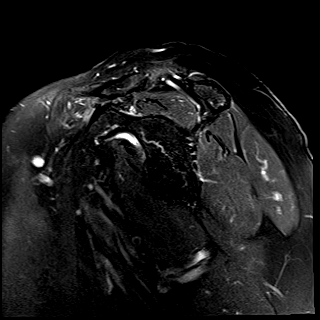
[im 21/28]
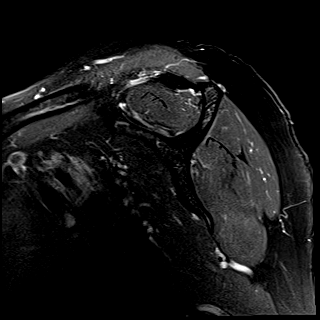
[im 24/28]
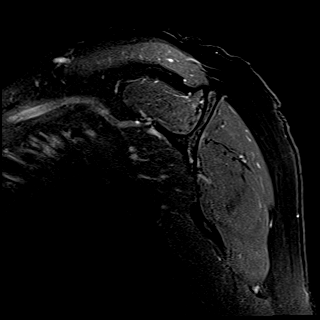
[im 28/28]
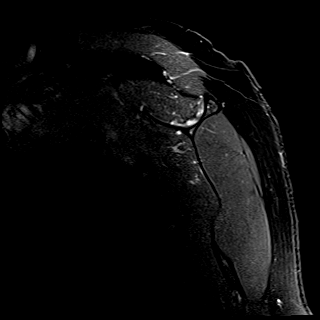

[29 of 40 positions shown; findings below may reference images not displayed]

FINDINGS: Rotator cuff:

Supraspinatus and infraspinatus: Extensive high-grade partial-thickness bursal sided and undersurface tearing of the supraspinatus tendon, near the insertion. Mild tendinosis of the infraspinatus with small interstitial tear near the myotendinous junction.

Subscapularis: Small partial-thickness undersurface tear, at the superior insertion.

Teres minor: Intact. 

Cuff muscles: Minimal atrophy of the supraspinatus muscle. No muscle edema.

Acromioclavicular joint: Mild DJD.

FRANK ERICK bursa: No bursitis.

Long head biceps tendon: Mild focal tendinosis, near the groove entrance.

Rotator interval: No scar or obliteration of fat.

Labrum: Tear of the superior labrum.

Cartilage: Chondral surface irregularity at the superior humeral head.

Marrow: Subcortical cystic change at the superior lateral humeral head, at the supraspinatus tendon insertion.

No joint effusion. No mass. No fluid collection.
IMPRESSION: 1. Extensive high-grade partial-thickness tearing of the supraspinatus tendon, at the insertion. No muscle atrophy.

2. Mild glenohumeral and AC joint DJD.

## 2020-10-04 IMAGING — MR MRI TSPINE WO/W CONTRAST
10 of 11 series · 37 of 48 positions shown · IV contrast (prohance)
Comparison: Thoracic spine without contrast 08/06/2020

HISTORY: 75-year-old female with mid back, possible syrinx.
TECHNIQUE: Multiplanar, multisequence MR images of the thoracic spine were obtained before and after administration of intravenous contrast.  

CONTRAST: 15 mL of ProHance.

[Series 9: ii_aaspine_tspine · coronal · 1.7mm · 1.67mm/px · 10 of 160 slices shown]
[im 10/160]
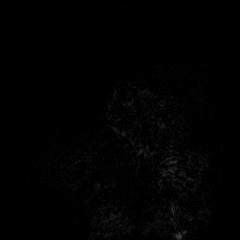
[im 29/160]
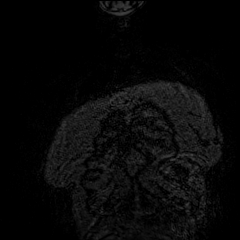
[im 47/160]
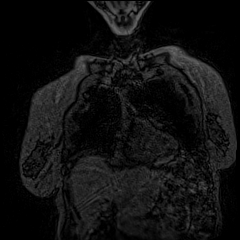
[im 66/160]
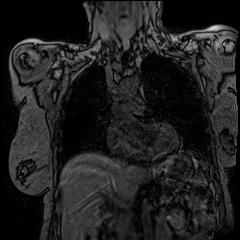
[im 85/160]
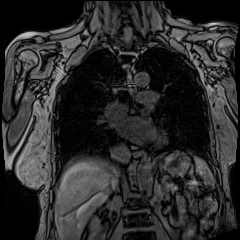
[im 94/160]
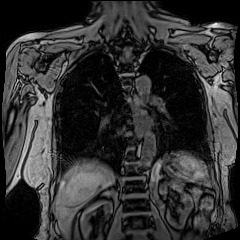
[im 113/160]
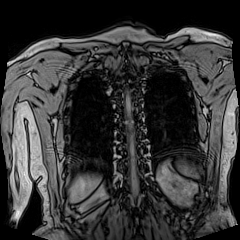
[im 131/160]
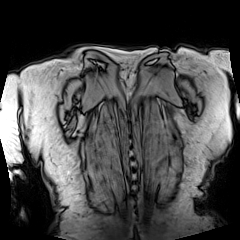
[im 141/160]
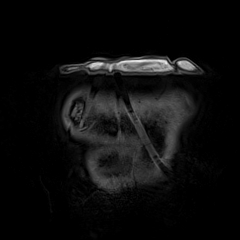
[im 150/160]
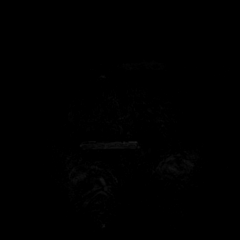

[Series 20: t2_sag · sagittal · 3.0mm · 0.89mm/px · 3 of 21 slices shown]
[im 1/21]
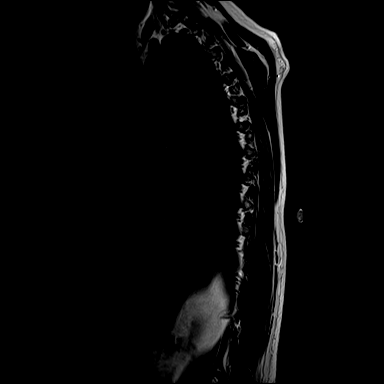
[im 11/21]
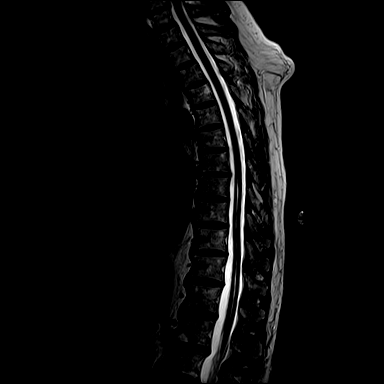
[im 21/21]
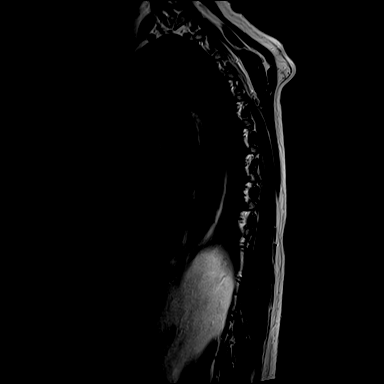

[Series 21: t1_sag · sagittal · 3.0mm · 0.89mm/px · 2 of 21 slices shown]
[im 1/21]
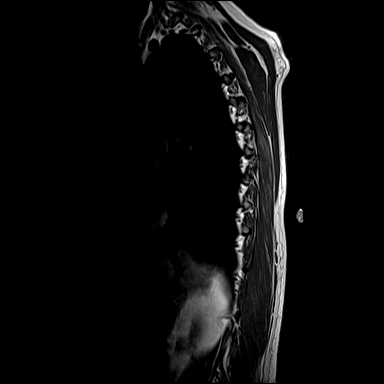
[im 21/21]
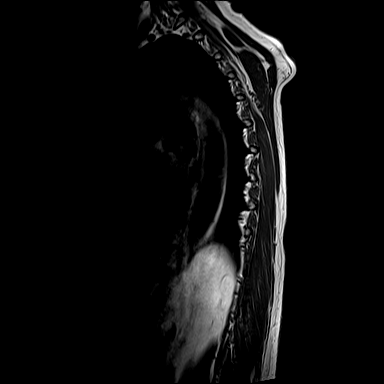

[Series 22: ir_sag · sagittal · 3.0mm · 0.66mm/px · 2 of 21 slices shown]
[im 1/21]
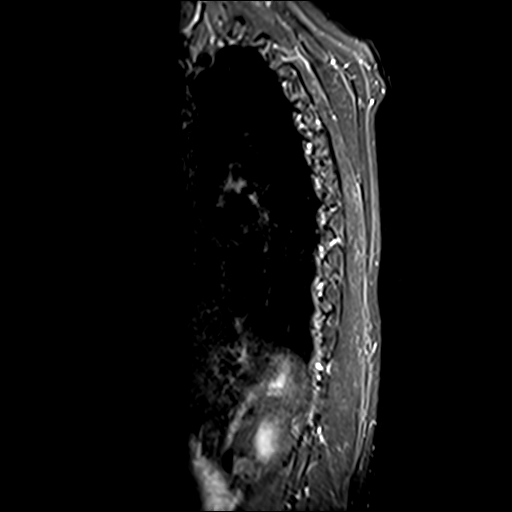
[im 21/21]
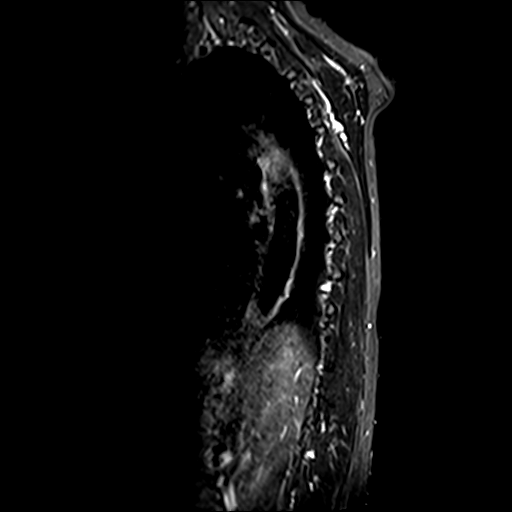

[Series 23: t2_axial · axial · 3.5mm · 0.62mm/px · z∈[-149,-19]mm · 3 of 30 slices shown (1 of 2)]
[im 1/30]
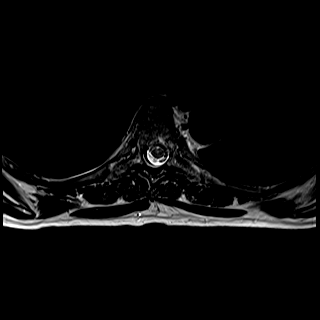
[im 15/30]
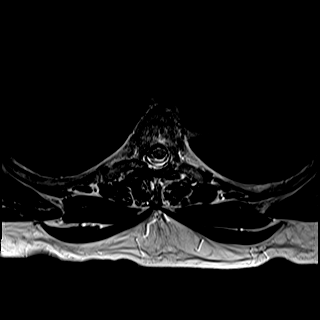
[im 30/30]
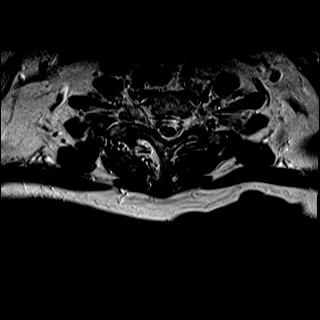

[Series 24: t2_axial · axial · 3.5mm · 0.62mm/px · z∈[-293,-112]mm · 4 of 40 slices shown (2 of 2)]
[im 1/40]
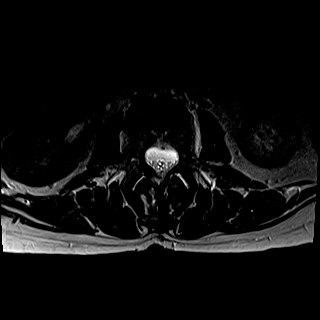
[im 14/40]
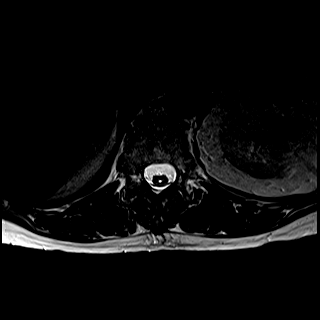
[im 27/40]
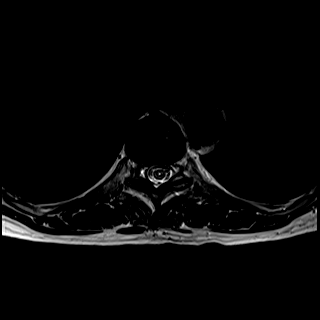
[im 40/40]
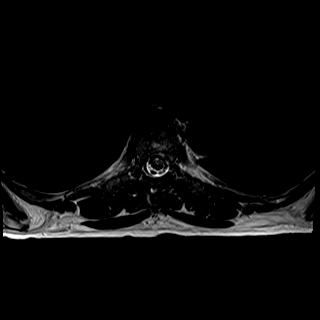

[Series 25: t1_axial_fs_pre · axial · 3.5mm · 0.78mm/px · z∈[-149,-19]mm · 3 of 30 slices shown (1 of 2)]
[im 1/30]
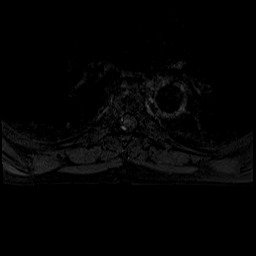
[im 15/30]
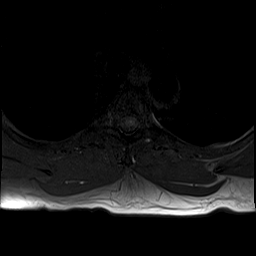
[im 30/30]
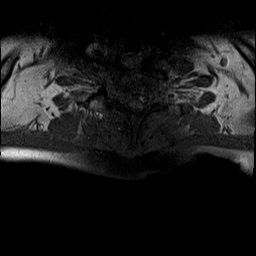

[Series 26: t1_axial_fs_pre · axial · 3.5mm · 0.39mm/px · z∈[-293,-112]mm · 4 of 40 slices shown (2 of 2)]
[im 1/40]
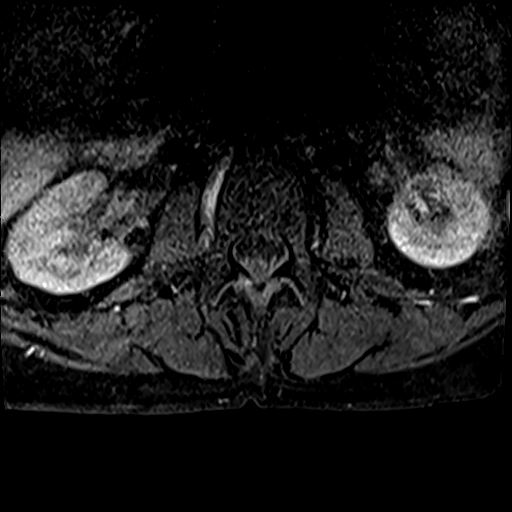
[im 14/40]
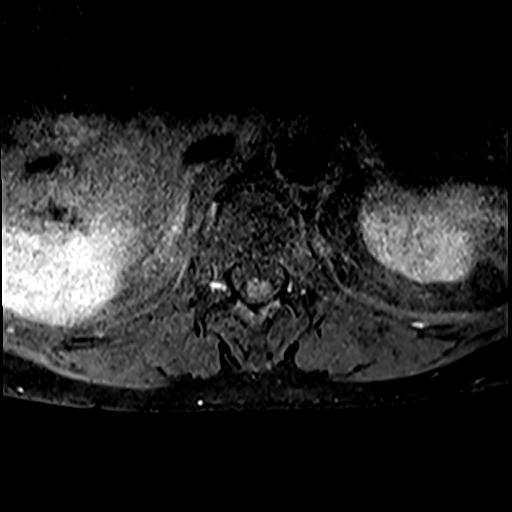
[im 27/40]
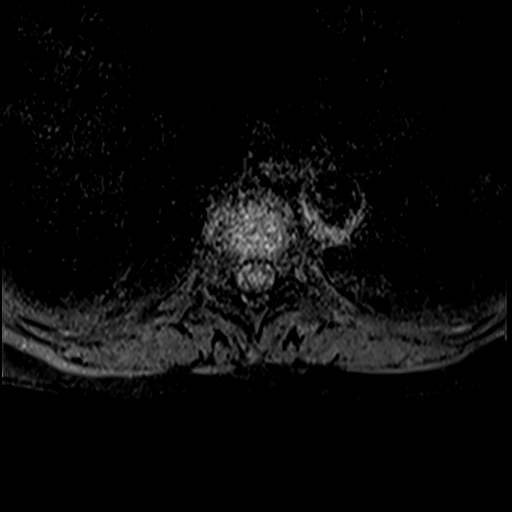
[im 40/40]
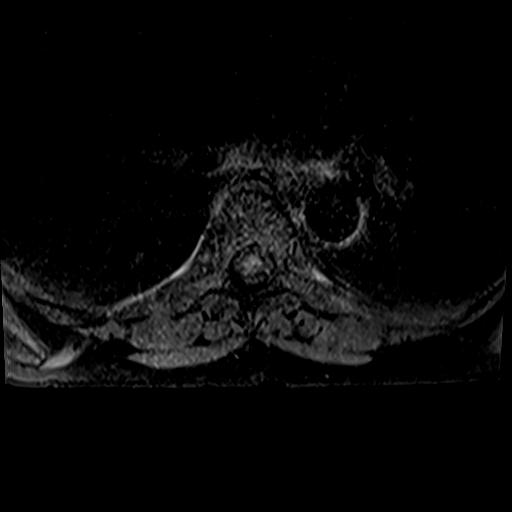

[Series 27: t1_axial_fs_+c · axial · 3.5mm · 0.78mm/px · z∈[-149,-19]mm · 3 of 30 slices shown (1 of 2)]
[im 1/30]
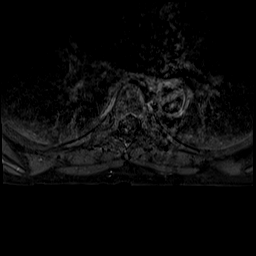
[im 15/30]
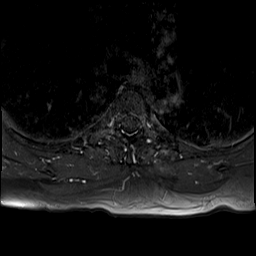
[im 30/30]
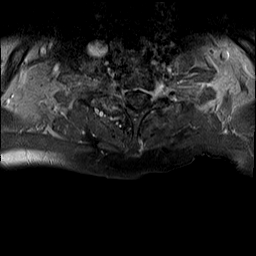

[Series 28: t1_axial_fs_+c · axial · 3.5mm · 0.39mm/px · z∈[-293,-173]mm · 3 of 40 slices shown (2 of 2)]
[im 1/40]
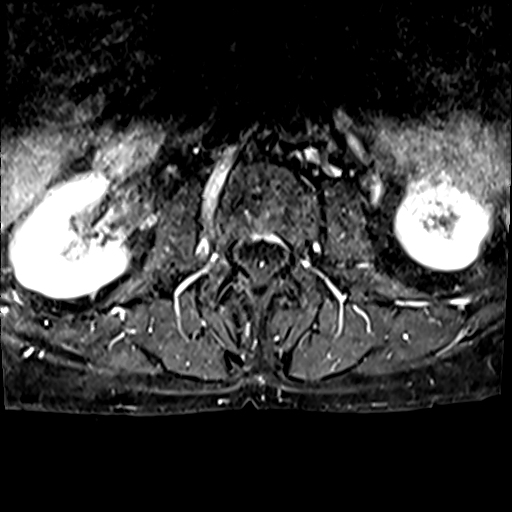
[im 14/40]
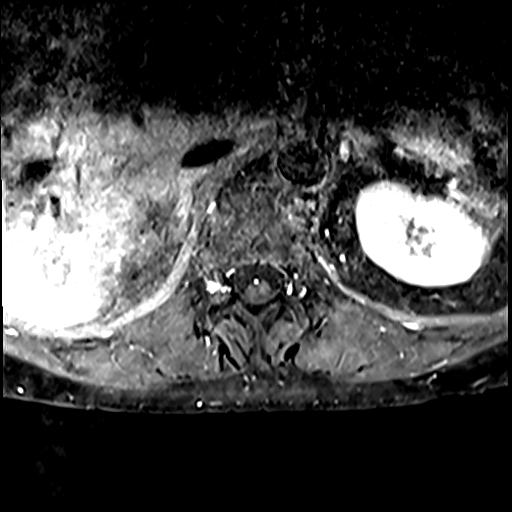
[im 27/40]
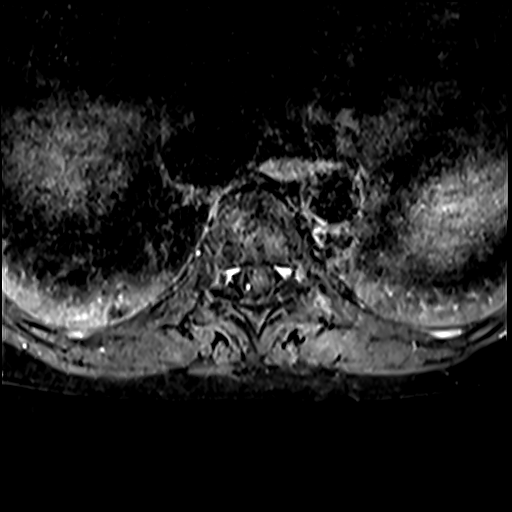

[37 of 48 positions shown; findings below may reference images not displayed]

FINDINGS: COUNT/LABELING: Please note that spinal labeling was performed assuming there are 12 rib-bearing thoracic vertebrae.

ALIGNMENT: No subluxations.

VERTEBRAE:  Vertebral body heights are maintained. Mild degenerative endplate signal changes. Multiple T1 and T2 hyperintense lesions most likely represent intraosseous hemangiomas.

INTERVERTEBRAL DISCS:  Multilevel disc desiccation without cystic and height loss.

PARASPINAL SOFT TISSUES:  No paraspinal soft tissue signal abnormalities.

SPINAL CORD:  Redemonstrated smooth intermittent enlargement of the central canal of the spinal cord most notably at T8-9 and T11-12 without abnormal enhancement.

FINDINGS BY LEVEL:

No high-grade canal stenosis or foraminal narrowing in the thoracic spine.
IMPRESSION: Intermittent enlargement of the central canal at T8-9 and T11-12 without abnormal enhancement most likely congenital in etiology.

## 2021-04-02 IMAGING — CR SHOULDER LT 2-3 VWS
1 series · 3 of 3 positions shown · non-contrast
Comparison: None.

INDICATION: Pain in left shoulder
TECHNIQUE: 3 views left shoulder.

[Series 1: internal rotate · 0.17mm/px · 3 of 3 slices shown]
[im 1/3]
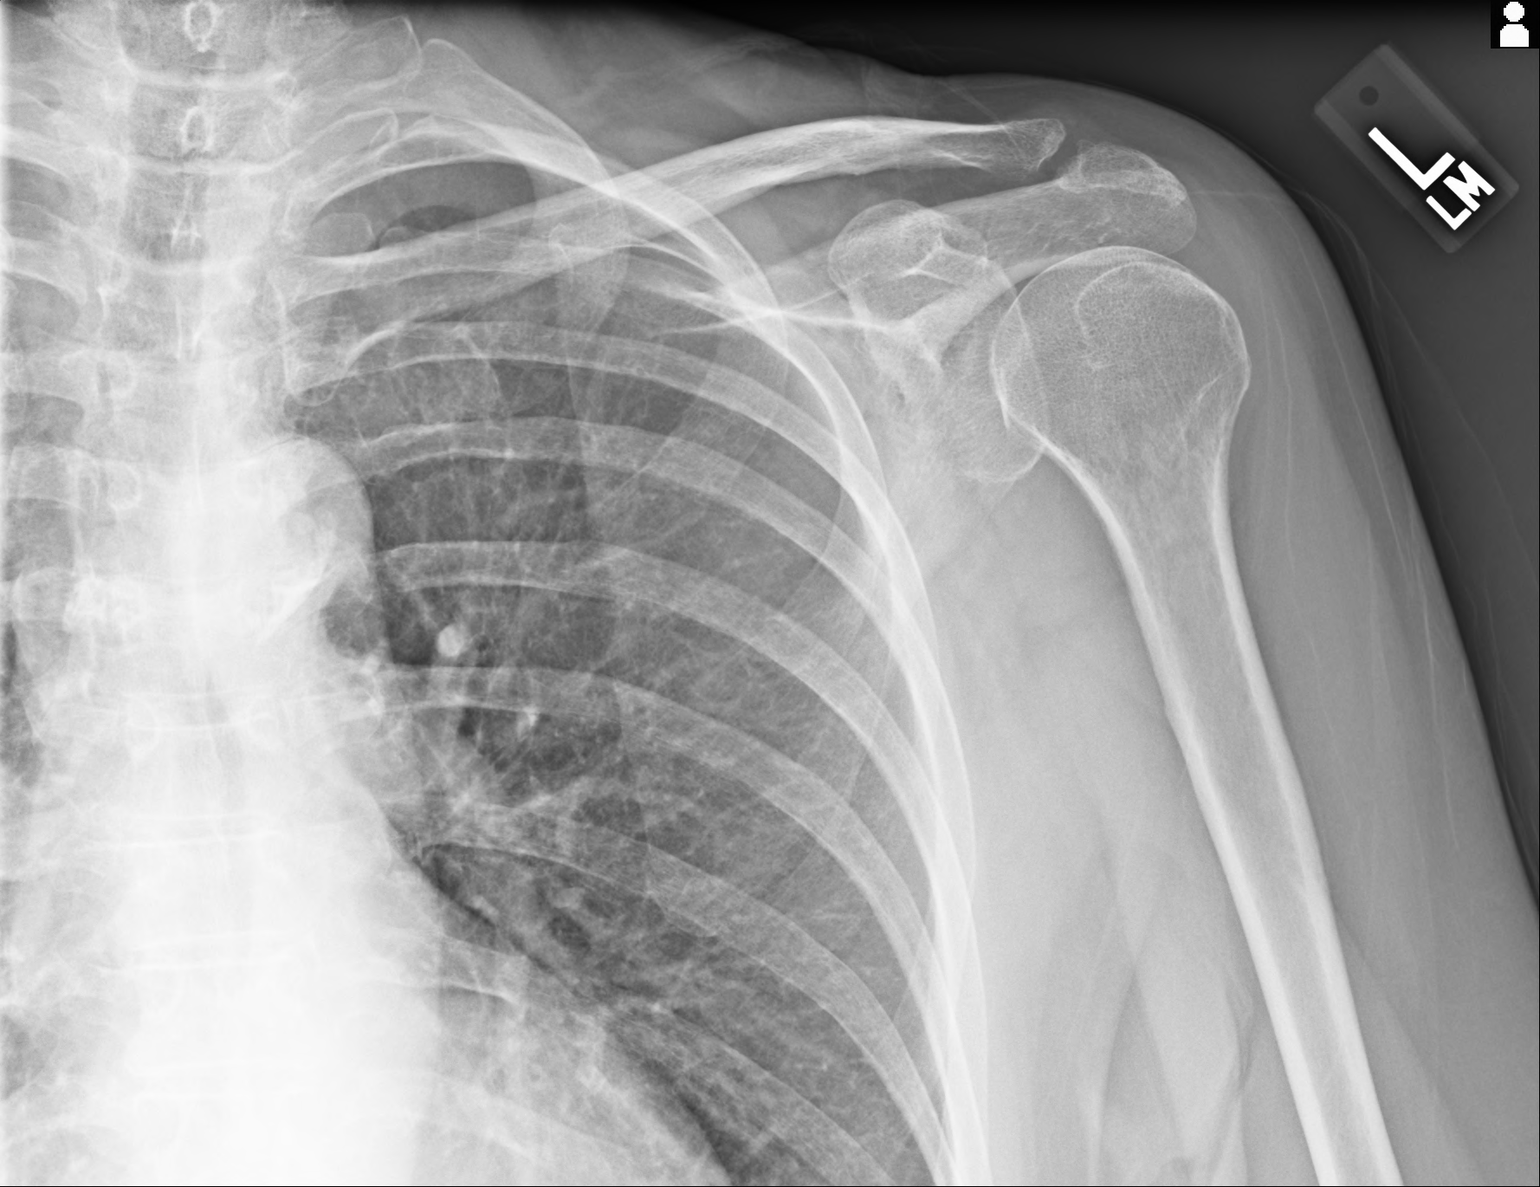
[im 2/3]
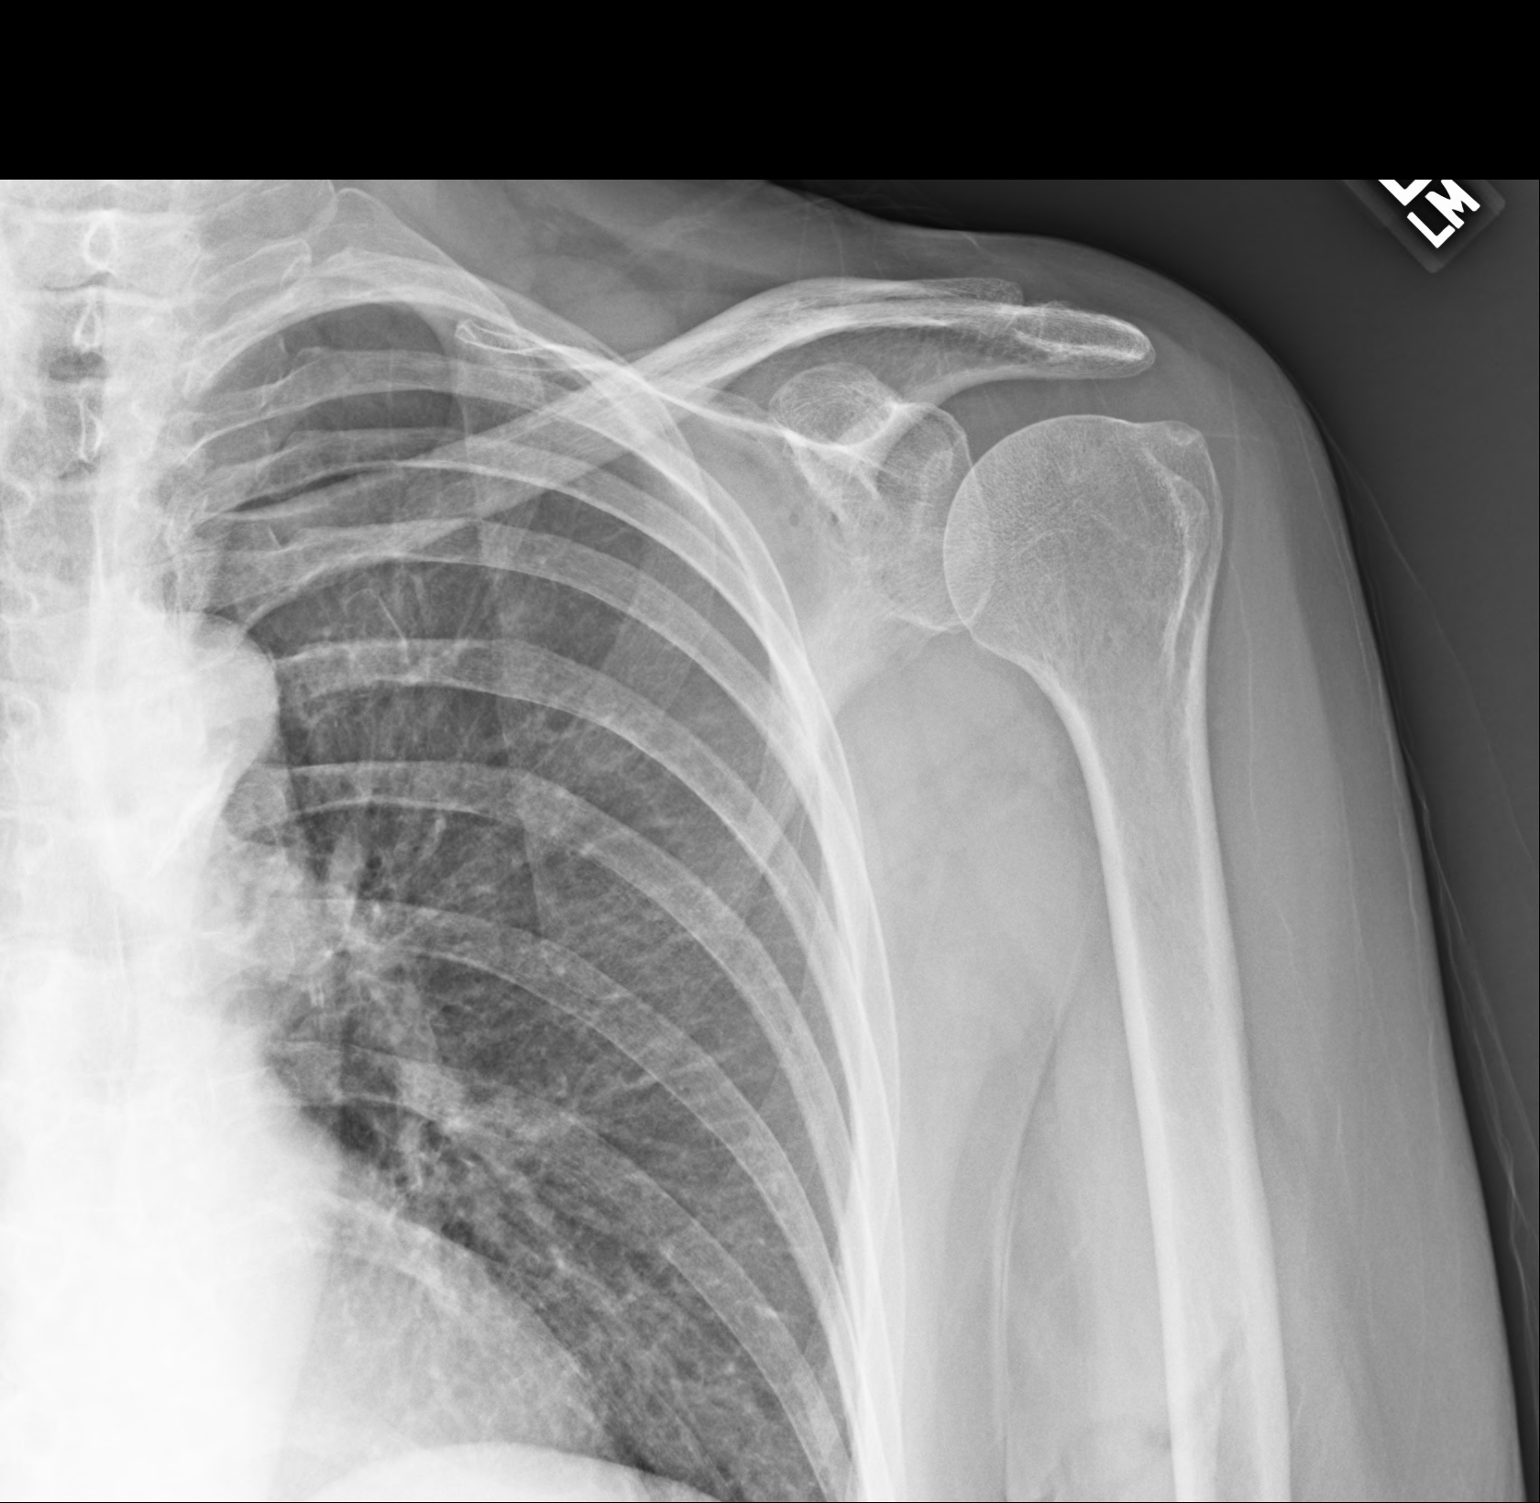
[im 3/3]
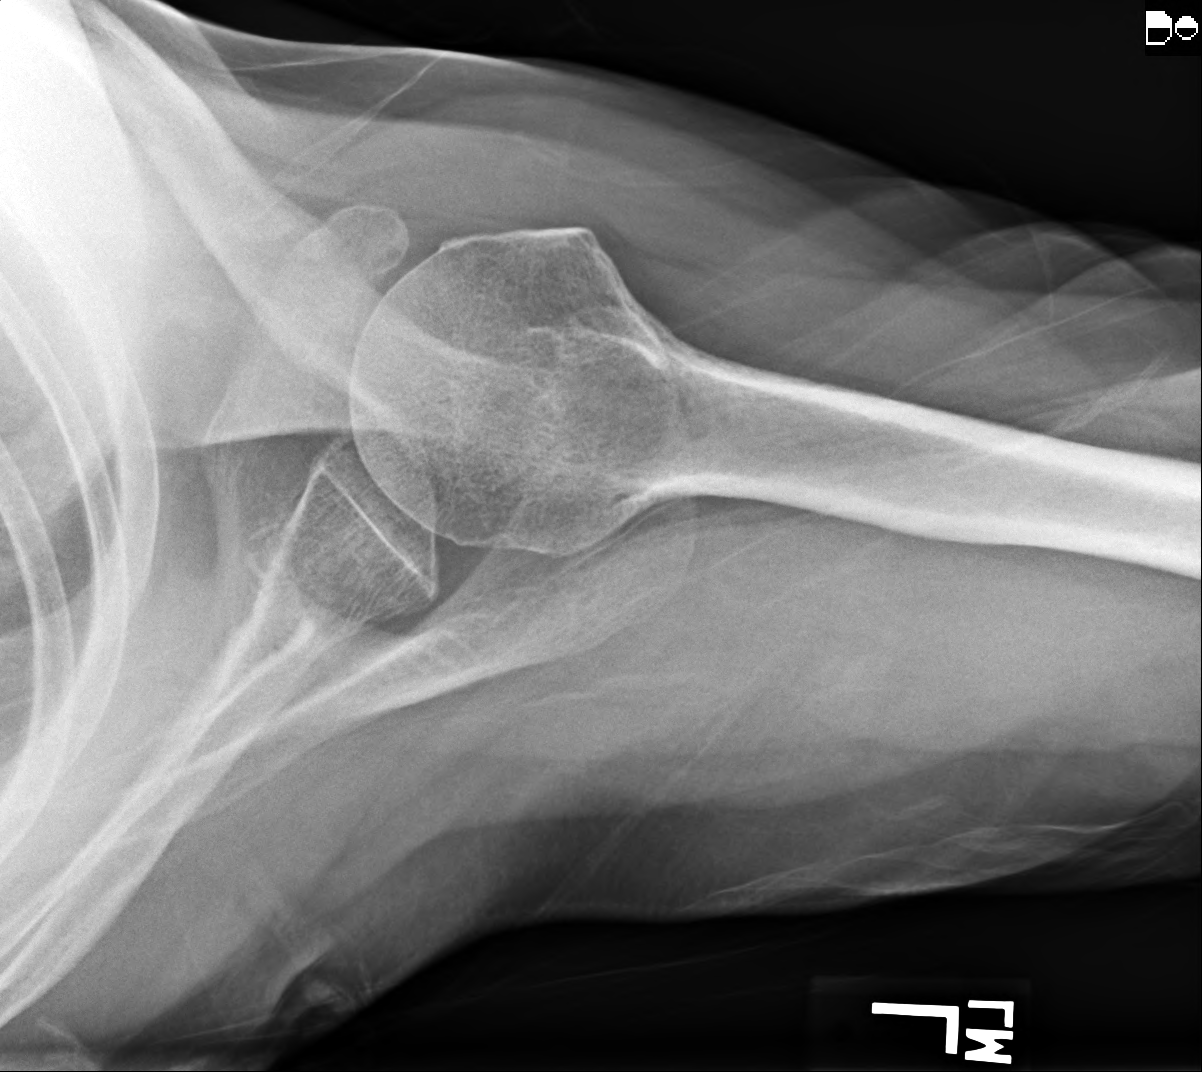

[3 of 3 positions shown; findings below may reference images not displayed]

FINDINGS: No acute fracture or dislocation. Mild acromioclavicular joint osteoarthritis. Glenohumeral joint spaces maintained. Mild reactive changes noted along the greater tuberosity. The visualized portion of the left chest is unremarkable.
IMPRESSION: 1.
No acute fracture of the left shoulder.

2.
Mild acromioclavicular joint osteoarthritis.

## 2021-04-02 IMAGING — CR SHOULDER RT 2-3 VWS
1 series · 3 of 3 positions shown · non-contrast
Comparison: 08/07/2020

Right shoulder radiographs, 3 views
INDICATION: Right shoulder pain

[Series 1: internal rotate · 0.17mm/px · 3 of 3 slices shown]
[im 1/3]
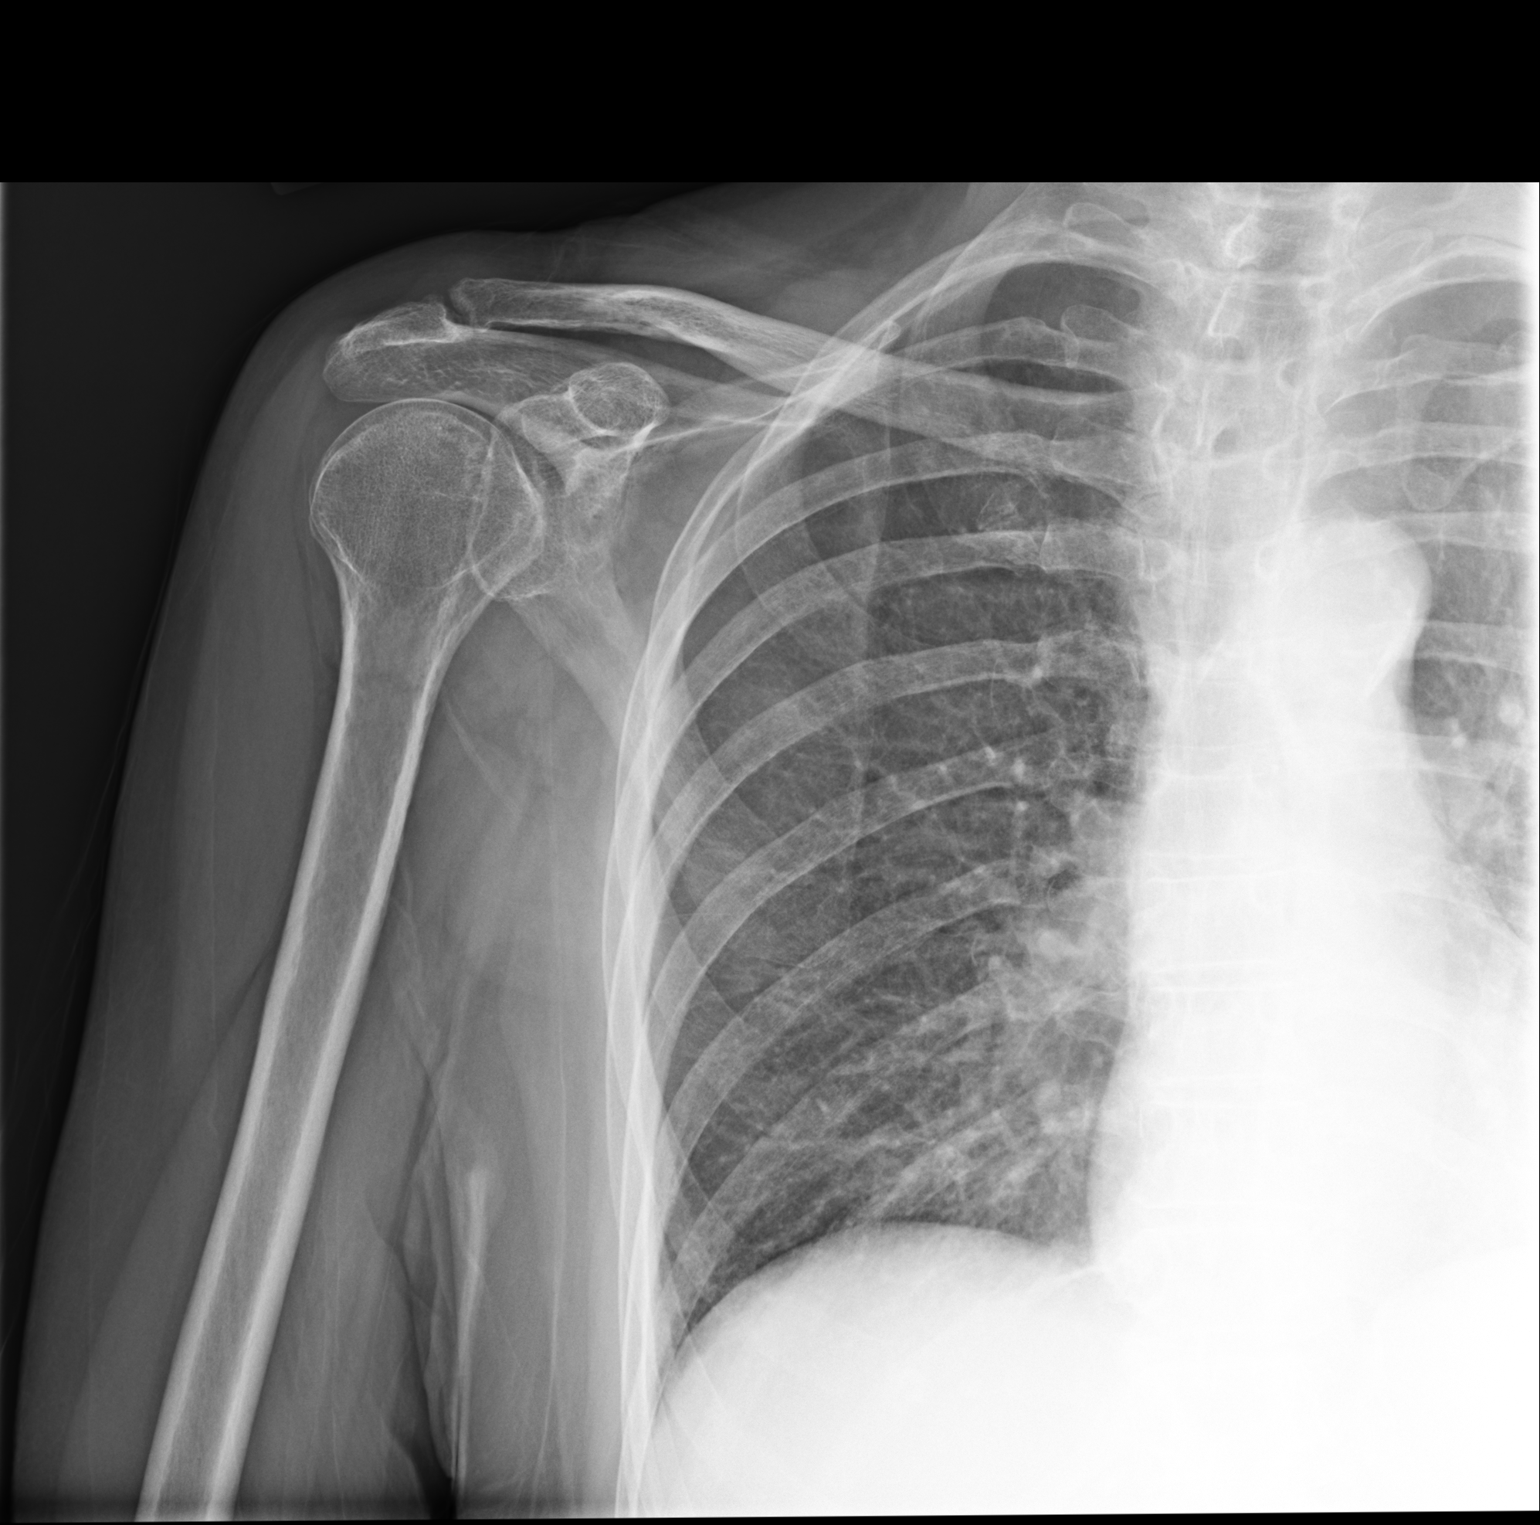
[im 2/3]
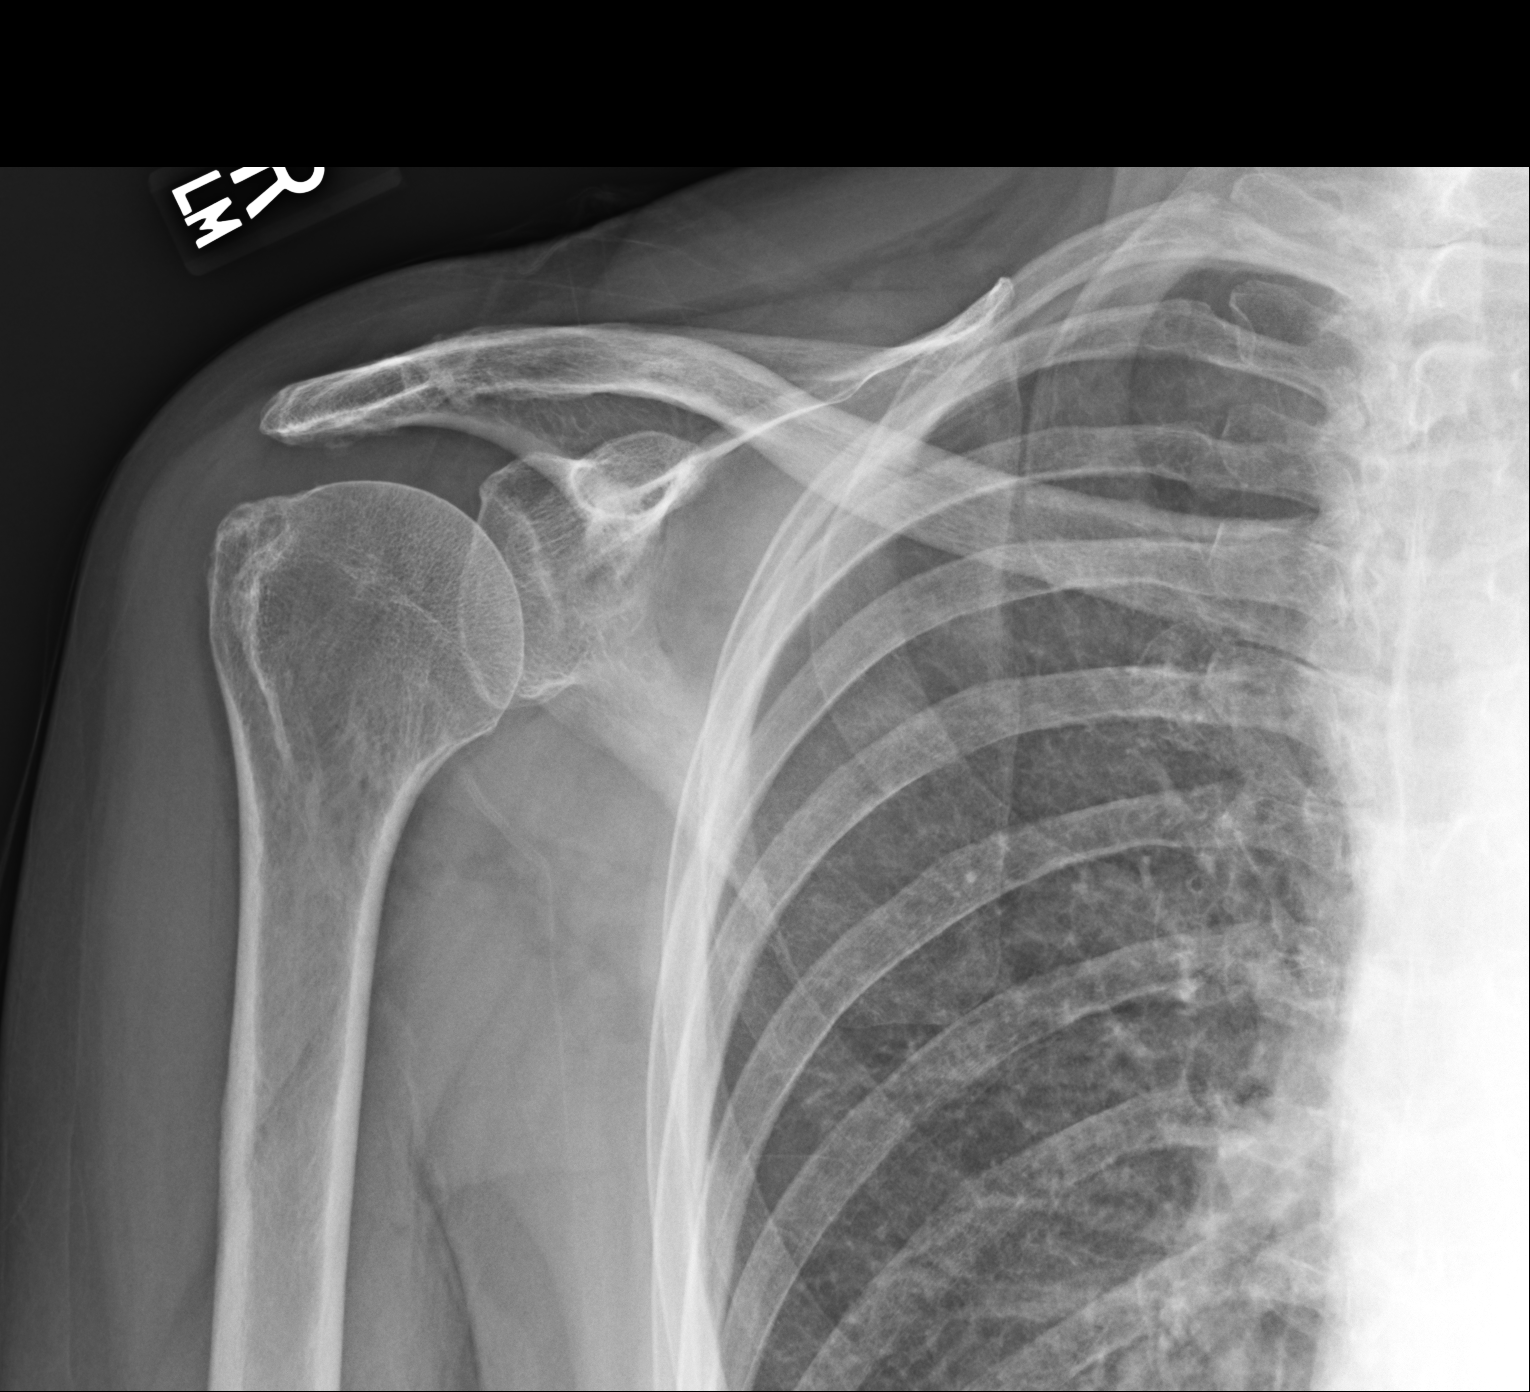
[im 3/3]
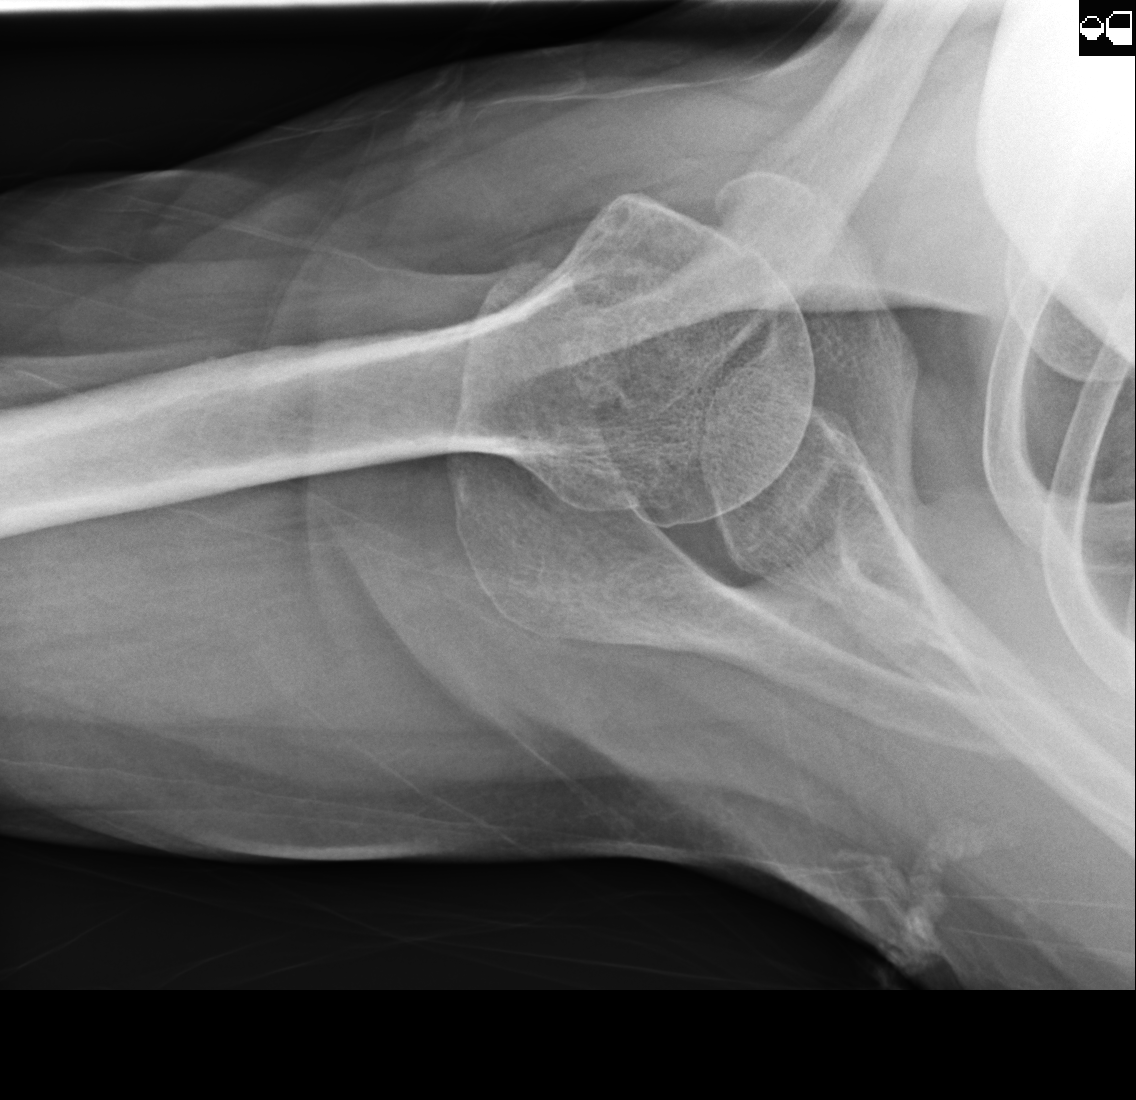

[3 of 3 positions shown; findings below may reference images not displayed]

FINDINGS: No acute fracture or dislocation. Minimal AC joint DJD. Glenohumeral joint space is intact. No erosion.
IMPRESSION: Unremarkable right shoulder radiographs.

## 2021-04-09 IMAGING — MR MRI SHOULDER RT WO CONTRAST
4 series · 40 of 40 positions shown · non-contrast
Comparison: Right shoulder radiographs 04/02/2021. Right shoulder MRI 08/08/2020.

INDICATION: Right shoulder pain.
TECHNIQUE: Multiplanar, multiecho imaging of the right shoulder was performed, including T1-weighted and fluid sensitive sequences without intravenous contrast.

[Series 6: t1_sag_obl · oblique · right · 3.0mm · 0.44mm/px · 12 of 26 slices shown]
[im 1/26]
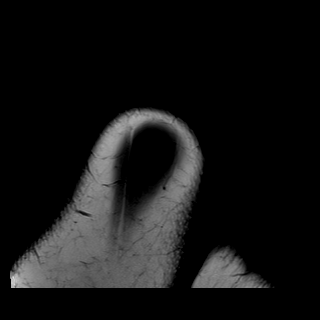
[im 3/26]
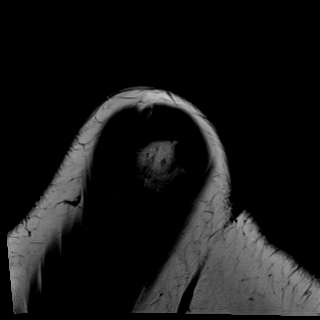
[im 5/26]
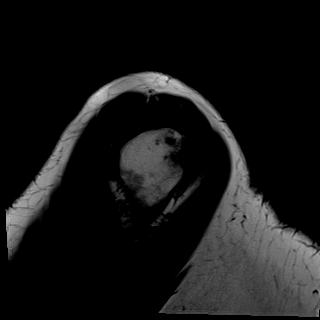
[im 7/26]
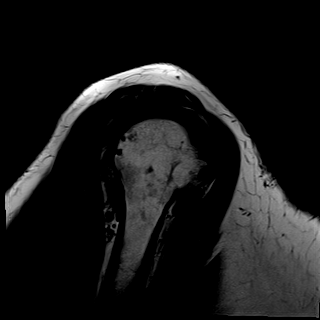
[im 10/26]
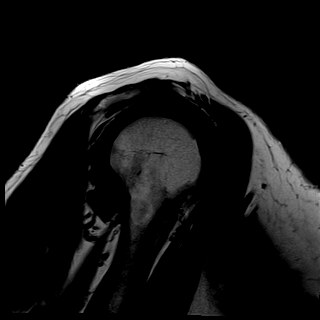
[im 12/26]
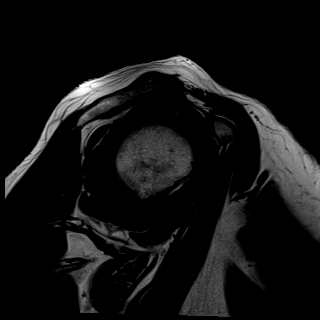
[im 14/26]
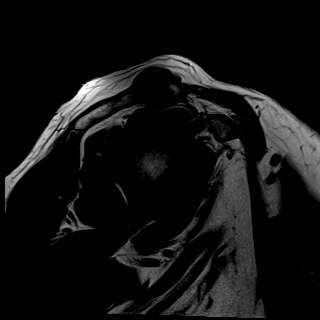
[im 16/26]
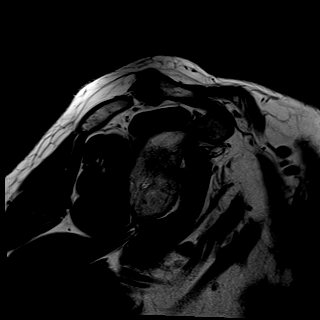
[im 19/26]
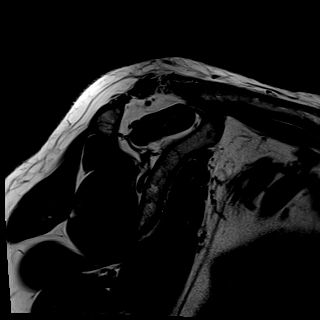
[im 21/26]
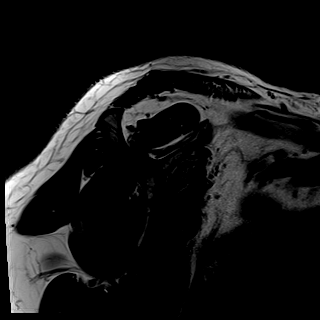
[im 23/26]
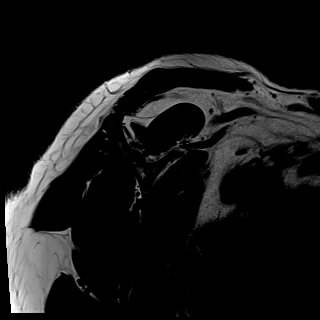
[im 26/26]
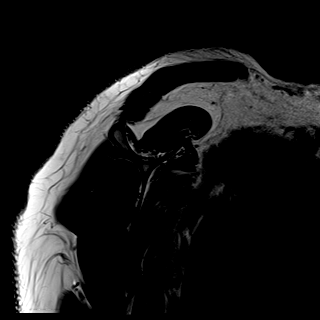

[Series 7: t2_sag_fs_blade · oblique · right · 3.0mm · 0.45mm/px · 9 of 18 slices shown]
[im 1/18]
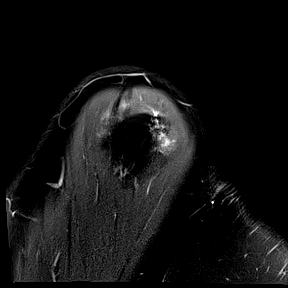
[im 3/18]
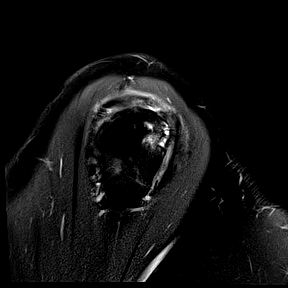
[im 5/18]
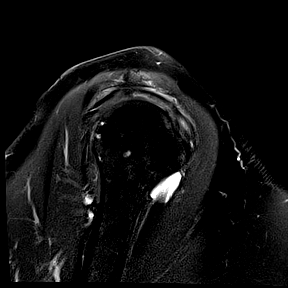
[im 7/18]
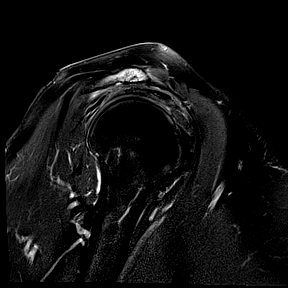
[im 9/18]
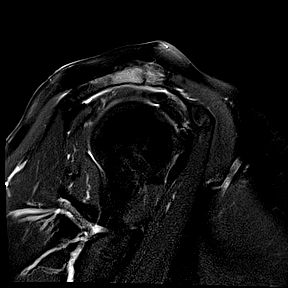
[im 11/18]
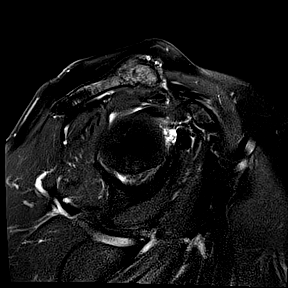
[im 13/18]
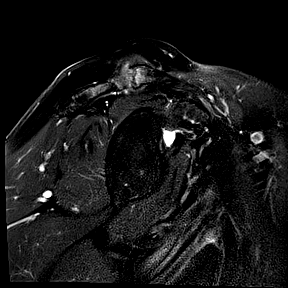
[im 15/18]
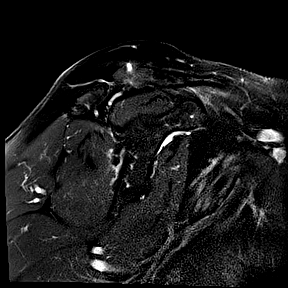
[im 18/18]
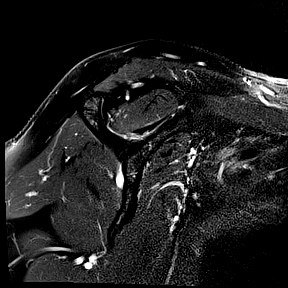

[Series 8: t2_cor_fs_blade · oblique · right · 3.0mm · 0.52mm/px · 9 of 18 slices shown]
[im 1/18]
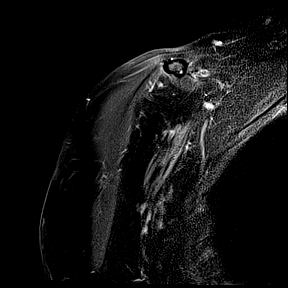
[im 3/18]
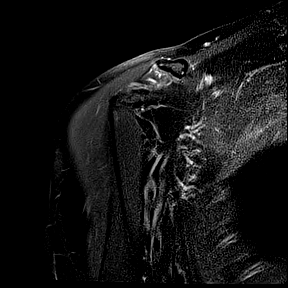
[im 5/18]
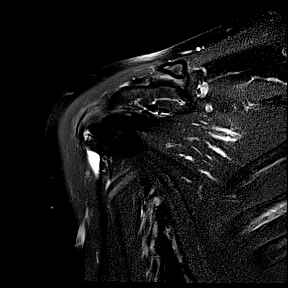
[im 7/18]
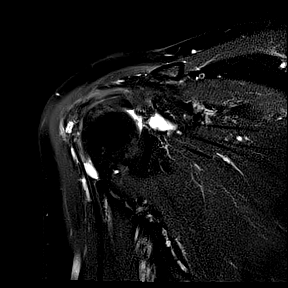
[im 9/18]
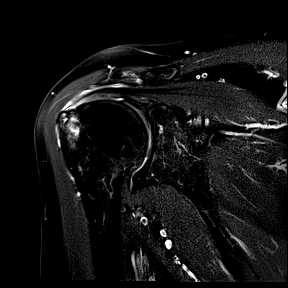
[im 11/18]
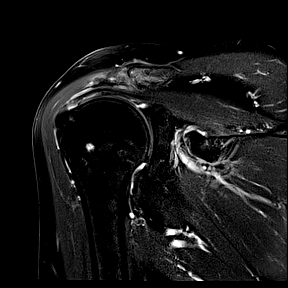
[im 13/18]
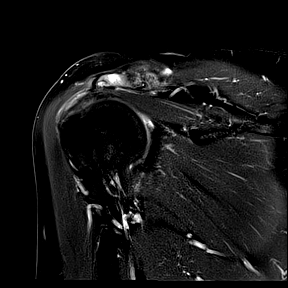
[im 15/18]
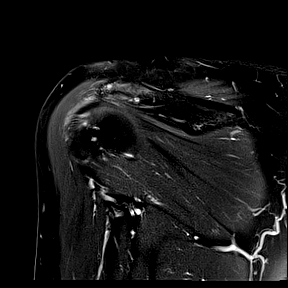
[im 18/18]
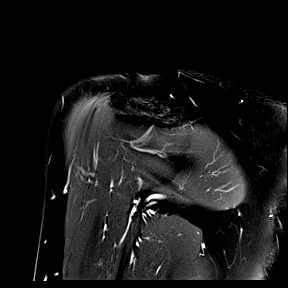

[Series 9: t2_axial_fs_blade · axial · right · 3.0mm · 0.52mm/px · z∈[+12,+87]mm · 10 of 20 slices shown]
[im 1/20]
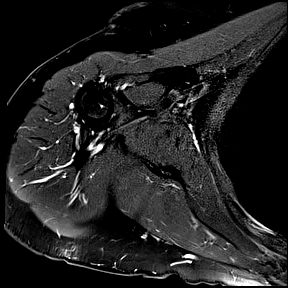
[im 3/20]
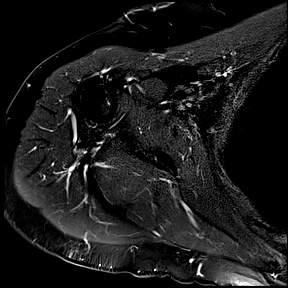
[im 5/20]
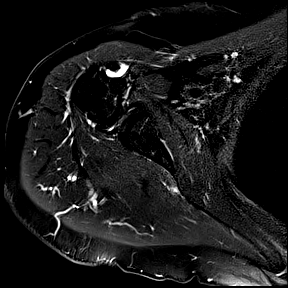
[im 7/20]
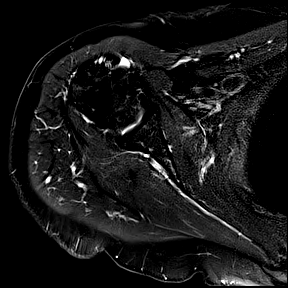
[im 9/20]
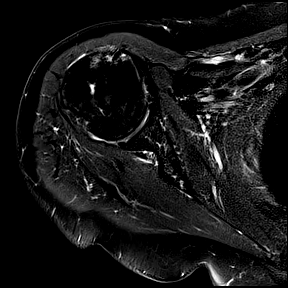
[im 11/20]
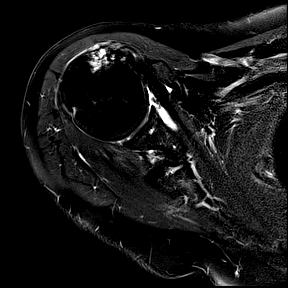
[im 13/20]
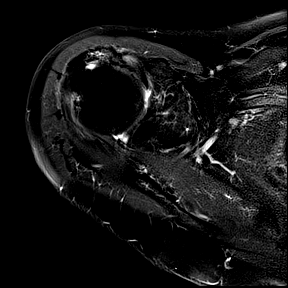
[im 15/20]
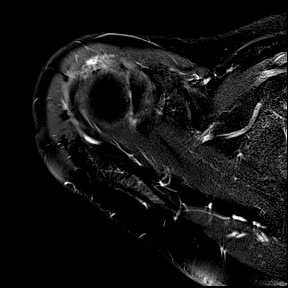
[im 17/20]
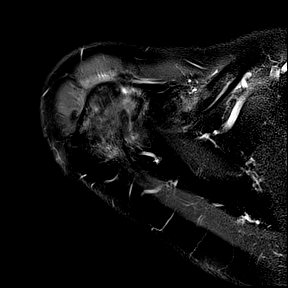
[im 20/20]
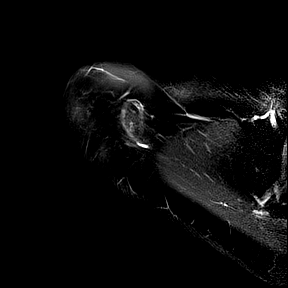

[40 of 40 positions shown; findings below may reference images not displayed]

FINDINGS: Rotator cuff:

Supraspinatus and infraspinatus: Extensive high-grade partial-thickness undersurface tearing of the supraspinatus tendon, progressed from prior. Mild tendinosis of the infraspinatus with small interstitial tear near the insertion.

Subscapularis: Large high-grade partial-thickness undersurface tear of the subscapularis tendon, at the superior insertion, slightly progressed.

Teres minor: Intact. 

Cuff muscles: Minimal atrophy of the supraspinatus and subscapularis muscles. No muscle edema.

Acromioclavicular joint: Moderate DJD.

BANDO bursa: Mild bursitis.

Long head biceps tendon: Moderate tendinosis of the intra-articular portion.

Rotator Interval: No scar or obliteration of fat.

Labrum: Intact.

Cartilage: Scattered chondral surface irregularity and thinning.

Marrow: No acute fracture. Marked subchondral marrow edema at the AC joint. Subcortical cystic change at the superior lateral humeral head. Subcortical cystic change/erosive change along the undersurface of the acromion process with associated marrow edema, slightly progressed from prior.

No joint effusion. No mass. No fluid collection.
IMPRESSION: 1.
Extensive high-grade partial-thickness undersurface tearing of the supraspinatus tendon, progressed from prior MRI 08/08/2020.

[DATE].
Large high-grade partial-thickness undersurface tear of the subscapularis tendon, at the superior insertion, slightly progressed.

3.
Moderate AC joint DJD, progressed from prior with significant pericapsular and subchondral marrow edema suggesting active inflammation.

4.
Moderate tendinosis of the intra-articular long head biceps.

5.
Mild glenohumeral joint DJD.

6.
Small subcortical cystic/erosive change along the inferior aspect of the acromion process with associated marrow edema, progressed from prior and is nonspecific.

## 2021-04-09 IMAGING — MR MRI SHOULDER LT WO CONTRAST
4 series · 40 of 40 positions shown · non-contrast
Comparison: Left shoulder radiographs 04/02/2021

INDICATION: Left shoulder pain status post fall, tried to catch herself
TECHNIQUE: Multiplanar, multiecho imaging of the left shoulder was performed, including T1-weighted and fluid sensitive sequences without intravenous contrast.

[Series 5: t2_axial_fs · axial · left · 3.0mm · 0.51mm/px · z∈[-35,+46]mm · 11 of 22 slices shown]
[im 1/22]
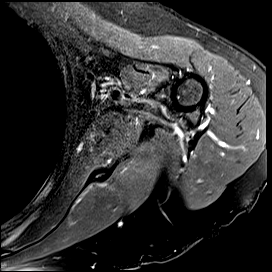
[im 3/22]
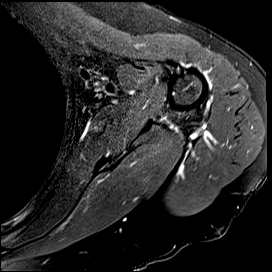
[im 5/22]
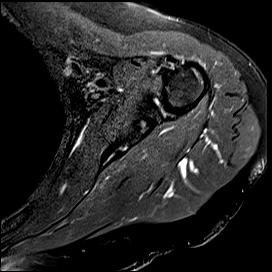
[im 7/22]
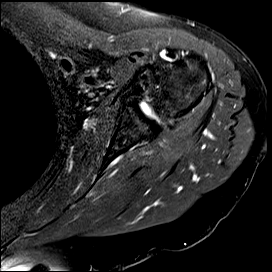
[im 9/22]
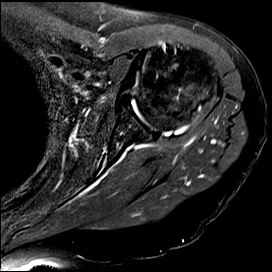
[im 11/22]
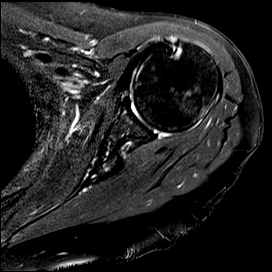
[im 13/22]
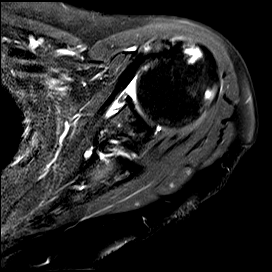
[im 15/22]
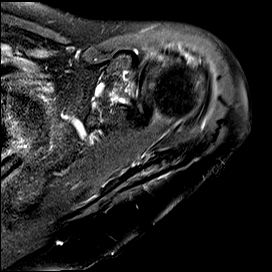
[im 17/22]
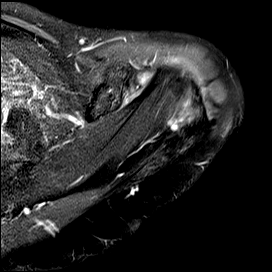
[im 19/22]
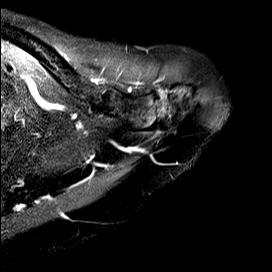
[im 22/22]
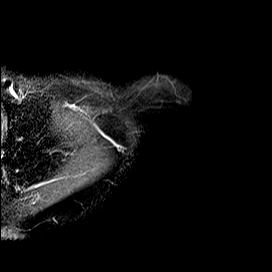

[Series 6: t2_cor_obl_fs · oblique · left · 3.0mm · 0.51mm/px · 7 of 17 slices shown]
[im 1/17]
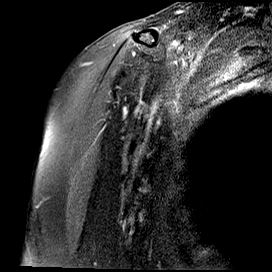
[im 3/17]
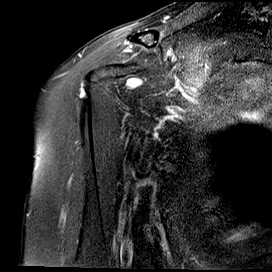
[im 6/17]
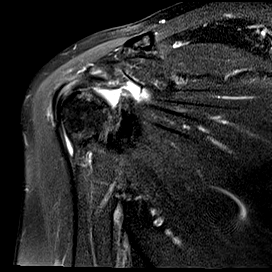
[im 9/17]
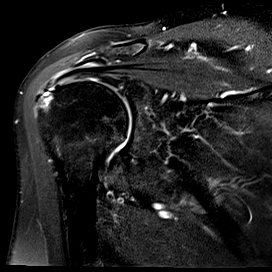
[im 11/17]
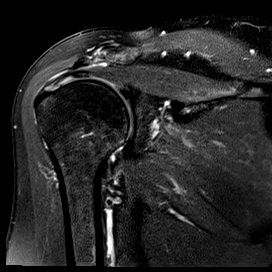
[im 14/17]
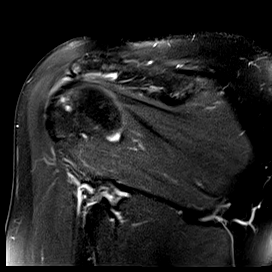
[im 17/17]
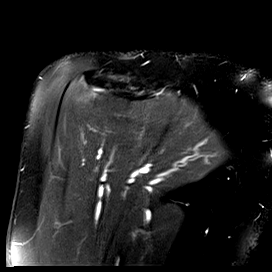

[Series 7: t2_sag_obl_fs · oblique · left · 3.0mm · 0.49mm/px · 11 of 26 slices shown]
[im 1/26]
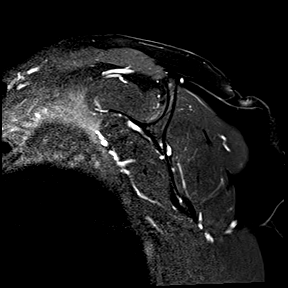
[im 3/26]
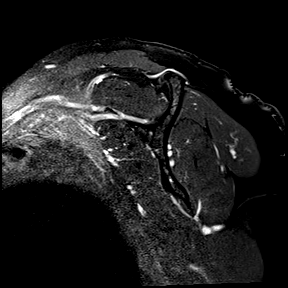
[im 6/26]
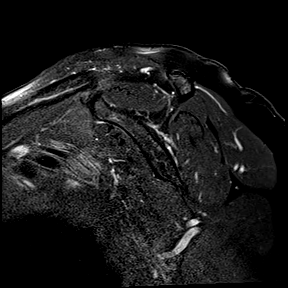
[im 8/26]
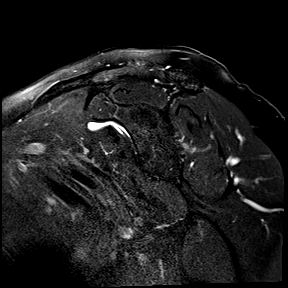
[im 11/26]
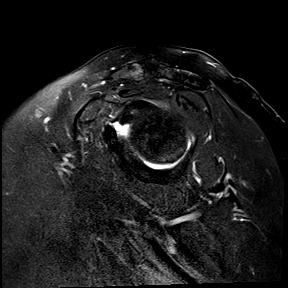
[im 13/26]
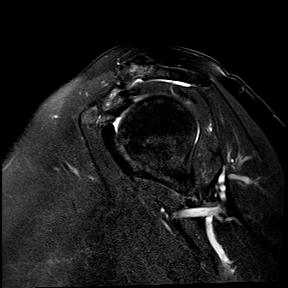
[im 16/26]
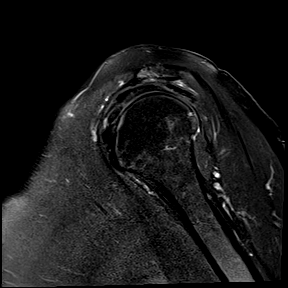
[im 18/26]
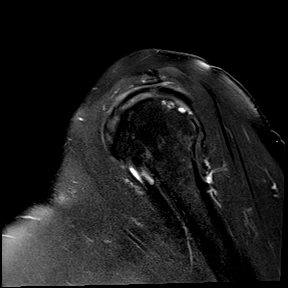
[im 21/26]
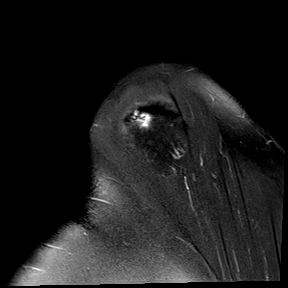
[im 23/26]
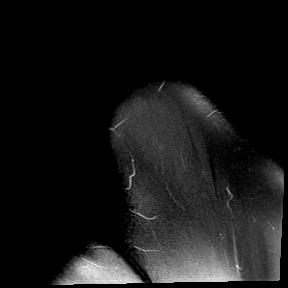
[im 26/26]
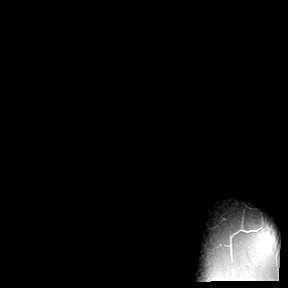

[Series 8: t1_sag_obl · oblique · left · 3.0mm · 0.44mm/px · 11 of 26 slices shown]
[im 1/26]
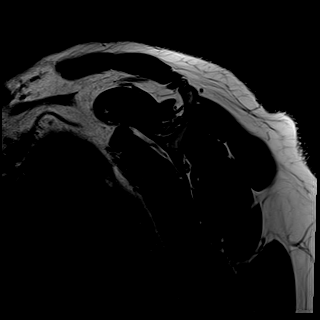
[im 3/26]
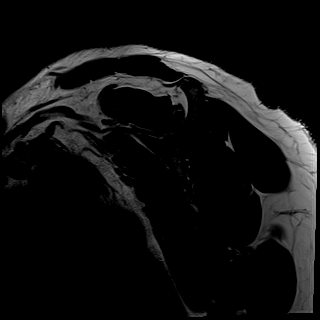
[im 6/26]
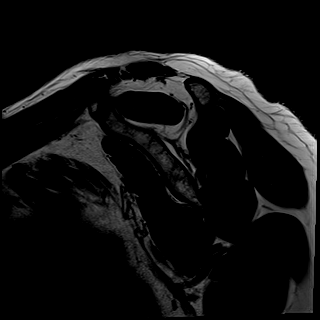
[im 8/26]
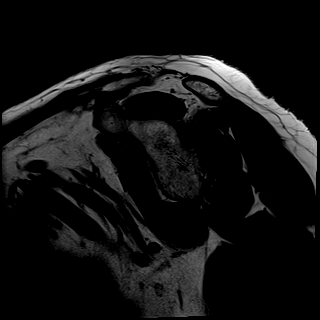
[im 11/26]
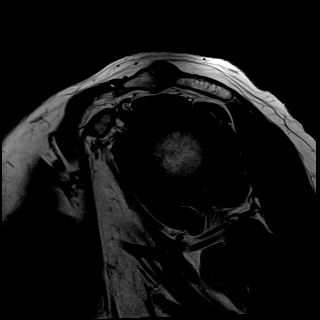
[im 13/26]
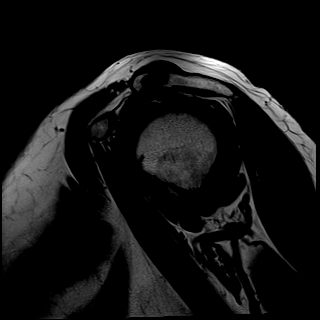
[im 16/26]
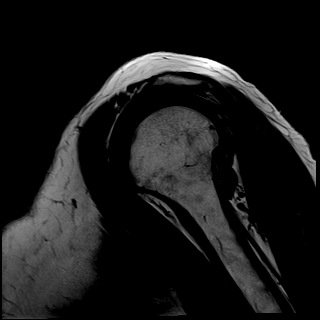
[im 18/26]
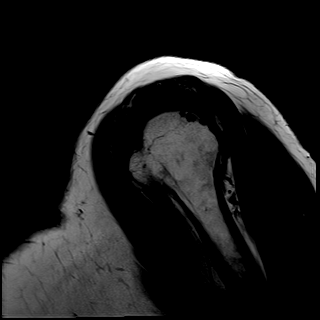
[im 21/26]
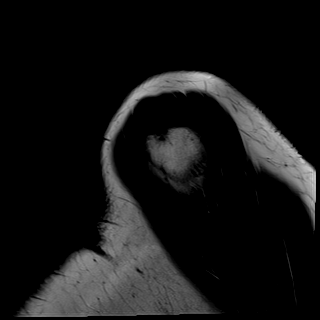
[im 23/26]
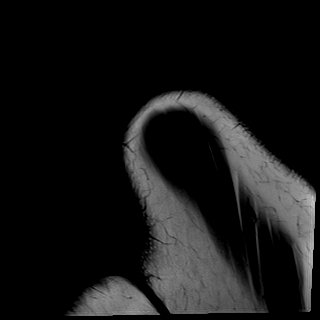
[im 26/26]
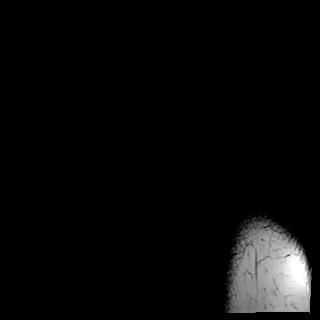

[40 of 40 positions shown; findings below may reference images not displayed]

FINDINGS: Rotator cuff:

Supraspinatus and infraspinatus: Large low-grade partial-thickness undersurface tear of the supraspinatus tendon. Infraspinatus tendon is intact.

Subscapularis: Small low-grade partial-thickness undersurface tear of the superior insertion.

Teres minor: Intact. 

Cuff muscles: Normal in size and signal intensity.  No fatty infiltration.

Acromioclavicular joint: Mild DJD.

ALEXS bursa: No bursitis.

Long head biceps tendon: Mild tendinosis at the groove entrance.

Rotator interval: No scar or obliteration of fat.

Labrum: Degeneration of the superior labrum with fraying at the free edge.

Cartilage: Scattered chondral surface irregularity.

Marrow: No acute fracture. No erosion. Small subcortical marrow edema at the superior lateral humeral head.

No joint effusion. No mass. No fluid collection. Thickening and edema of the inferior glenohumeral ligament suggesting mild adhesive capsulitis versus acute sprain.
IMPRESSION: 1.
Thickening and edema of the inferior glenohumeral ligament, suggesting mild adhesive capsulitis versus acute capsular sprain.

2.
Large low-grade partial-thickness undersurface tear of the supraspinatus tendon.

3.
Mild glenohumeral and AC joint DJD.

## 2021-05-08 IMAGING — CT CT CARDIAC CALCIUM SCORING
1 of 2 series · 11 of 20 positions shown, 14 images · non-contrast
Comparison: None

REASON FOR EXAM: Atherosclerotic heart disease of native coronary artery without angina pectoris
TECHNIQUE: Multiple helical CT images of the heart were acquired without the administration of IV contrast. Post processing software quantified calcium (Threshold 130 HU) in the coronary arteries with Agatston scores. Coronary screening is most appropriately used in asymptomatic patients with clinical risk factors for coronary atherosclerosis.

[Series 2: calcium score · axial · 0.37mm/px · z∈[-1220,-1104]mm · 11 of 93 slices shown, 14 images]
[im 8/93  vessel]
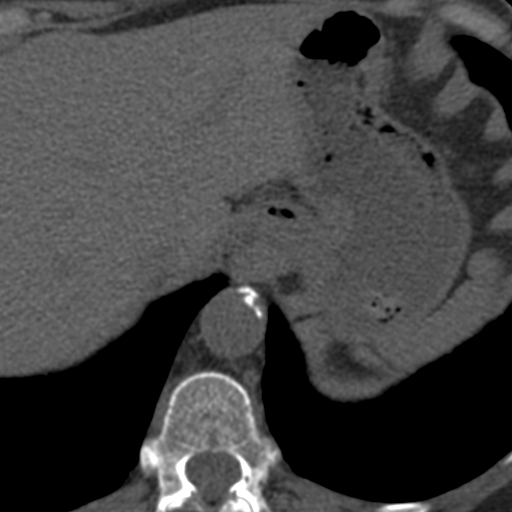
[im 8/93  lung]
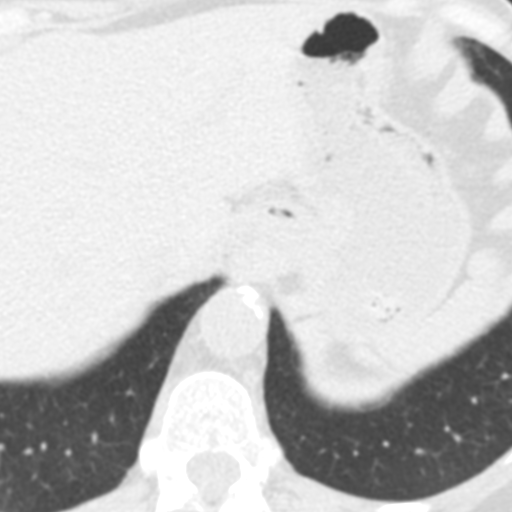
[im 16/93  vessel]
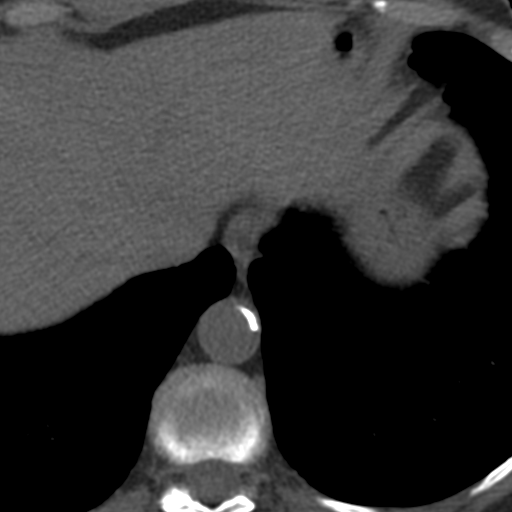
[im 24/93  vessel]
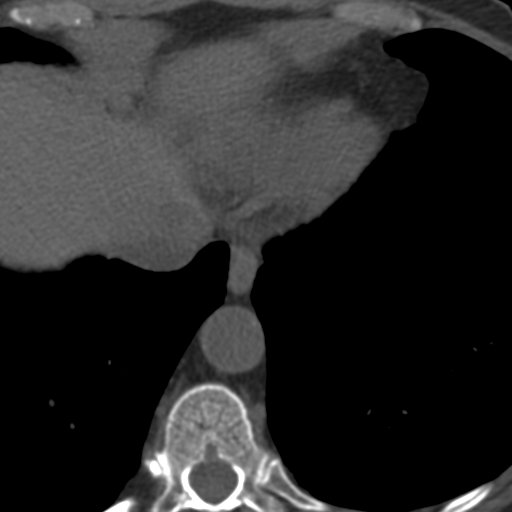
[im 31/93  vessel]
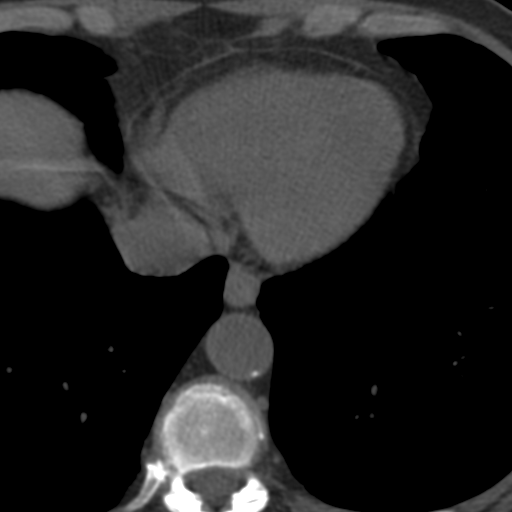
[im 39/93  vessel]
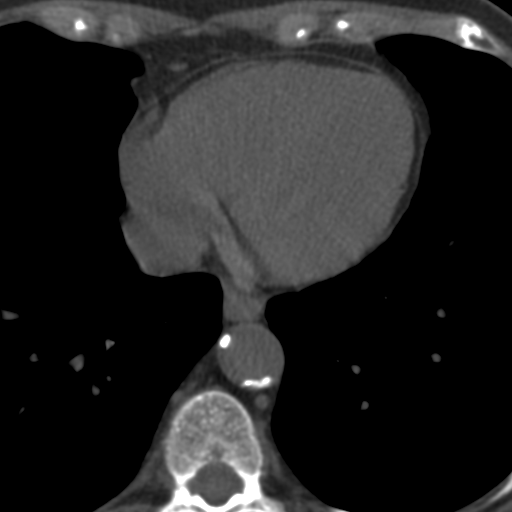
[im 39/93  lung]
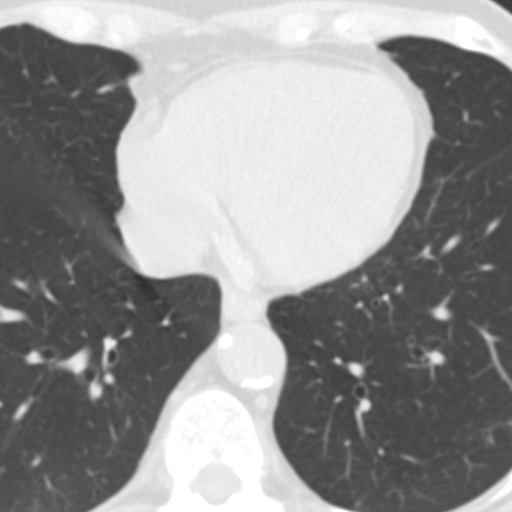
[im 47/93  vessel]
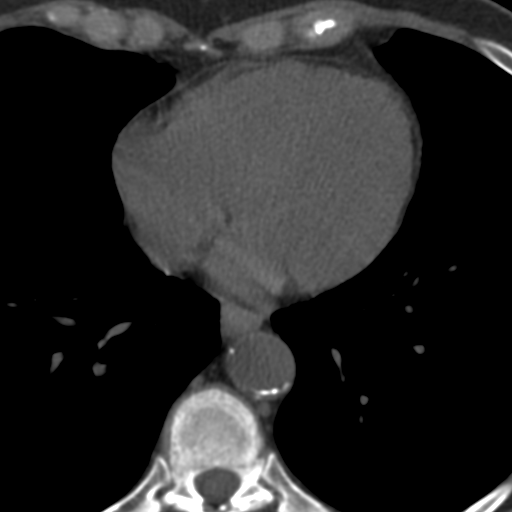
[im 54/93  vessel]
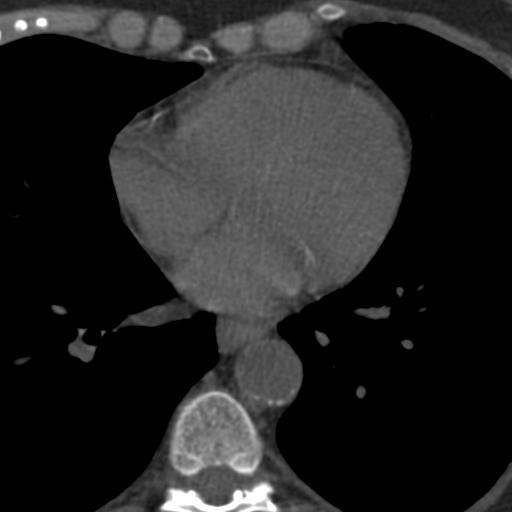
[im 62/93  vessel]
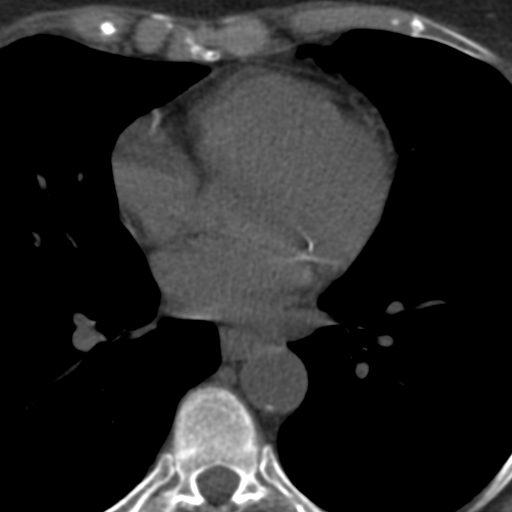
[im 70/93  vessel]
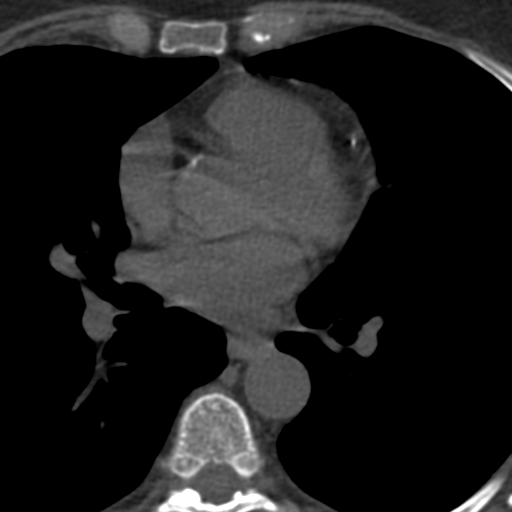
[im 70/93  lung]
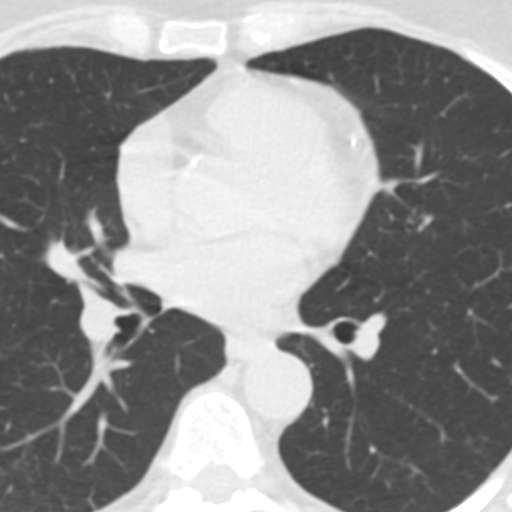
[im 77/93  vessel]
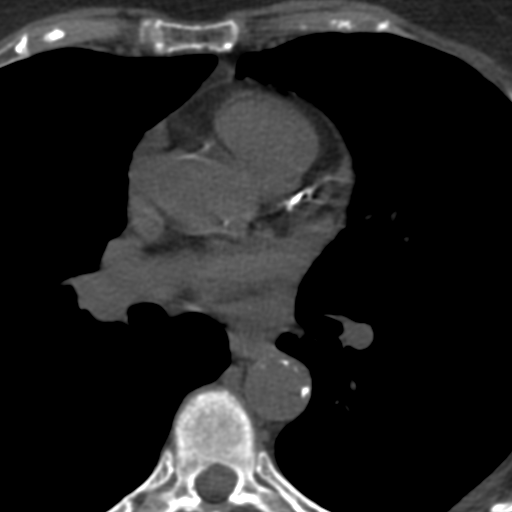
[im 85/93  vessel]
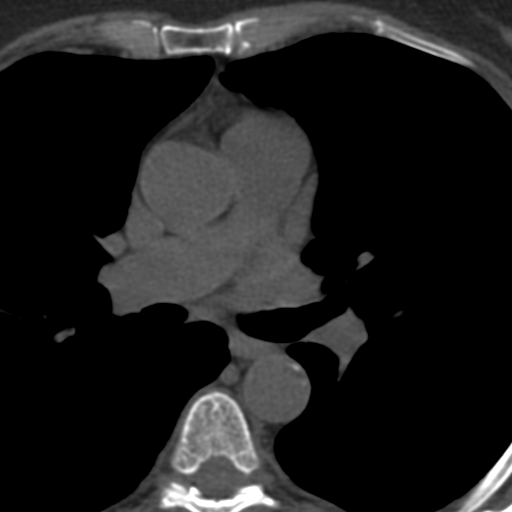

[11 of 20 positions shown; findings below may reference images not displayed]

Dose reduction technique used: Automated exposure control and adjustment of the mA and/or kV according to patient size. CT count in previous 12 months: 0

 Total radiation dose to patient is CTDIvol 7.66 mGy and DLP 134.00 mGy-cm.

NUMBER OF CALCIFIED CORONARY ARTERY PLAQUES

Left Main: 1

Left Anterior Descending: 4

Circumflex: 1

Right Coronary Artery: 2

Total: 8

CORONARY ARTERY CALCIUM SCORE

Left Main: 11

Left Anterior Descending:

Circumflex:

Right Coronary Artery: 3

TOTAL AGATSTON SCORE:

OTHER FINDINGS: The thoracic aorta is calcified with ectasia of the ascending aorta measuring 33 x 35.6 mm.
IMPRESSION: 1. Examination is technically satisfactory.

2. Agatston score 465.7. The patient has severe coronary artery disease with relative lifetime risk of coronary event of 7.2 times higher than a patient with known calcified coronary arteries.

3. There is calcification of the thoracic aorta with ectasia of the ascending aorta measuring approximately 33 x 35.6 mm.

0: No evidence of CAD (very low risk of future coronary event)

1-10: Minimal CAD (low risk of future coronary event)

11-100: Mild CAD (increased risk of future coronary event)

101-400: Moderate CAD (relative lifetime risk of coronary event is 4.3 times a patient with a score of 0)*

166-7666: Severe CAD (relative lifetime risk of coronary event is 7.2 times a patient with a score of 0)*

>0111 (relative lifetime risk of coronary event is 10.8 times a patient with a score of 0)*

*Sugianto, et al, Clinical expert consensus document on coronary calcium screening, J Am Coll Cardiology. 3774; [DATE]

## 2022-02-04 IMAGING — MR MRI LSPINE WO CONTRAST
5 of 6 series · 32 of 48 positions shown · IV contrast (agent unspecified)
Comparison: Lumbar spine radiographs 08/07/2020, lumbar spine MRI without contrast 08/07/2020

Images Obtained from [facility]
REASON FOR EXAM: Pain in right hip, chronic low back pain with right leg radiculopathy
TECHNIQUE: Multiplanar, multisequence MR images of the lumbar spine were obtained without intravenous contrast.
CONTRAST: None.

[Series 16: t2_sag · sagittal · 4.0mm · 0.88mm/px · 5 of 19 slices shown]
[im 1/19]
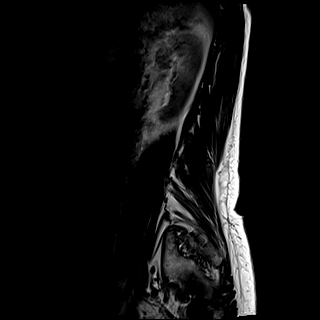
[im 5/19]
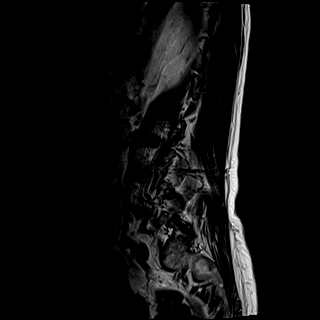
[im 10/19]
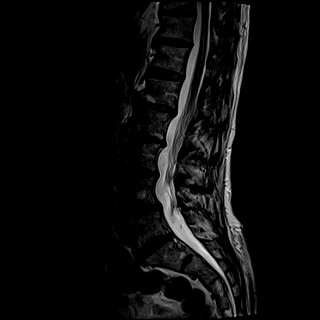
[im 14/19]
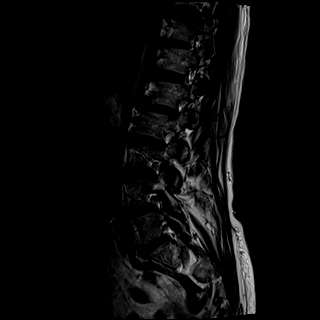
[im 19/19]
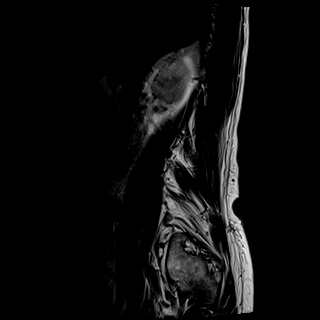

[Series 17: t1_sag · sagittal · 4.0mm · 0.88mm/px · 5 of 19 slices shown]
[im 1/19]
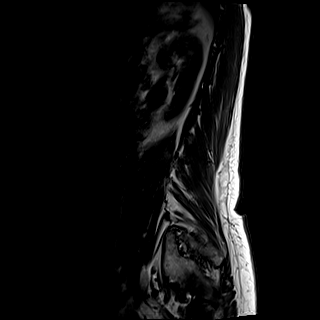
[im 5/19]
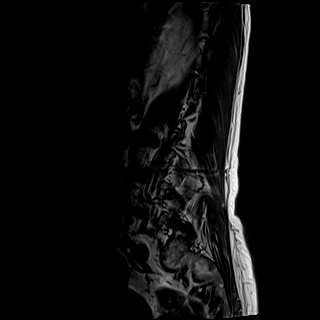
[im 10/19]
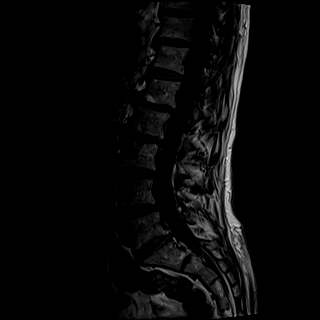
[im 14/19]
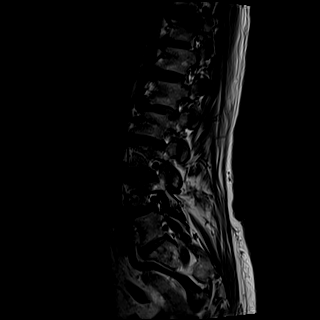
[im 19/19]
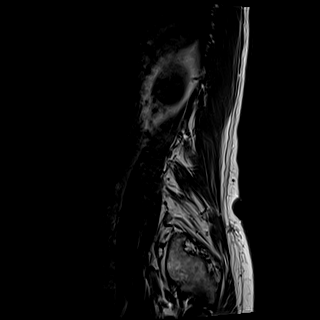

[Series 18: ir_sag · sagittal · 4.0mm · 1.09mm/px · 5 of 19 slices shown]
[im 1/19]
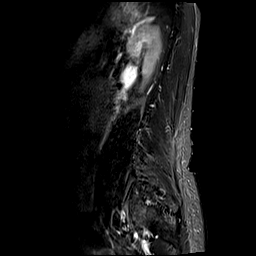
[im 5/19]
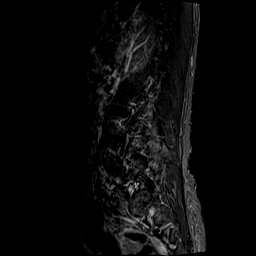
[im 10/19]
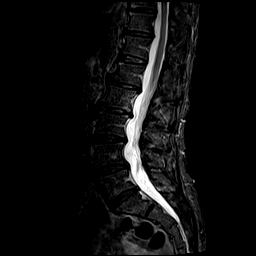
[im 14/19]
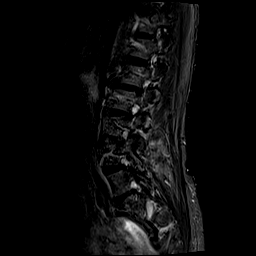
[im 19/19]
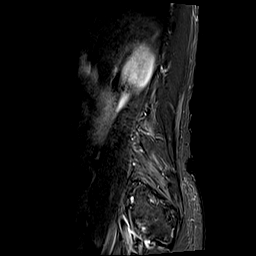

[Series 19: t2_axial · axial · 4.0mm · 0.62mm/px · z∈[-464,-282]mm · 10 of 39 slices shown]
[im 1/39]
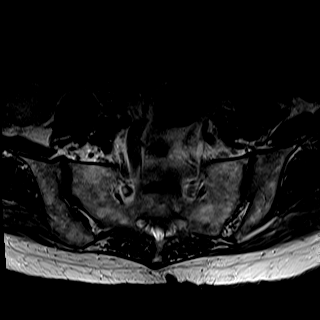
[im 5/39]
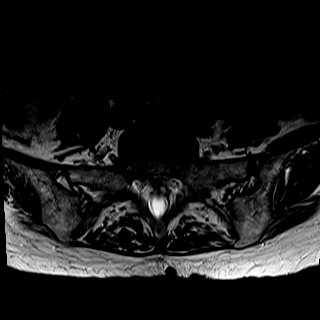
[im 9/39]
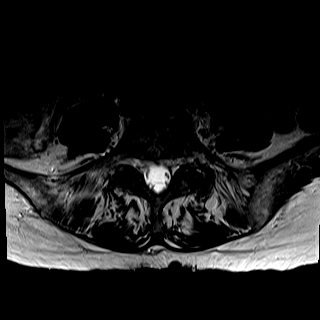
[im 13/39]
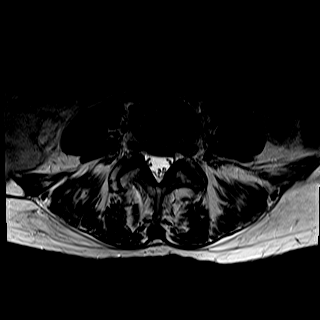
[im 17/39]
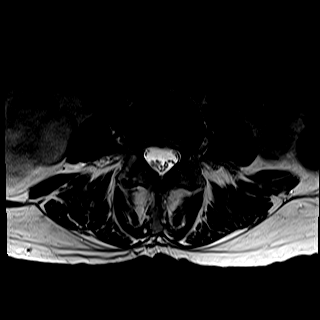
[im 22/39]
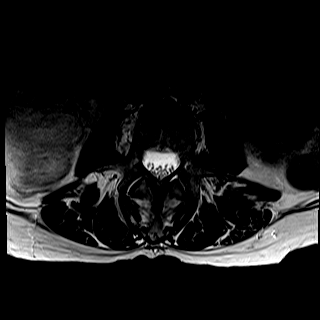
[im 26/39]
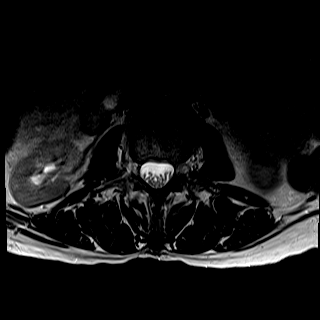
[im 30/39]
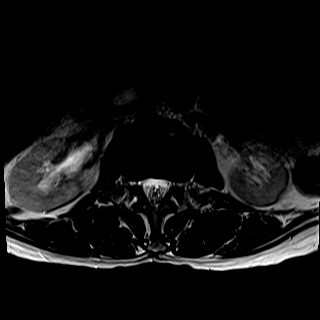
[im 34/39]
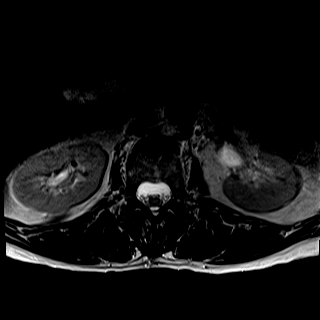
[im 39/39]
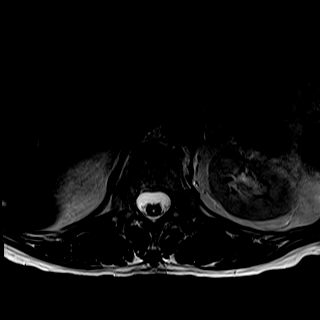

[Series 20: t1_axial_obl · axial · 3.0mm · 0.78mm/px · z∈[-496,-289]mm · 7 of 27 slices shown]
[im 1/27]
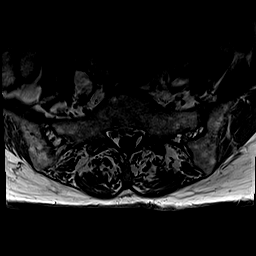
[im 5/27]
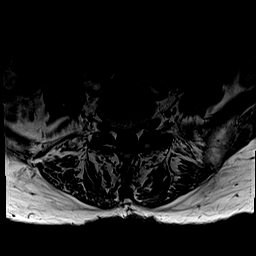
[im 9/27]
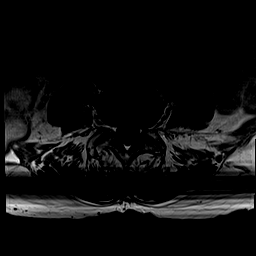
[im 14/27]
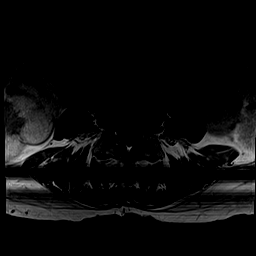
[im 18/27]
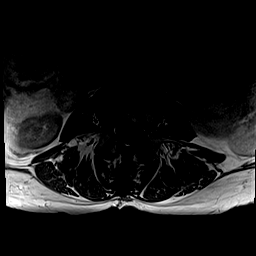
[im 22/27]
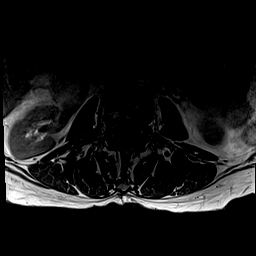
[im 27/27]
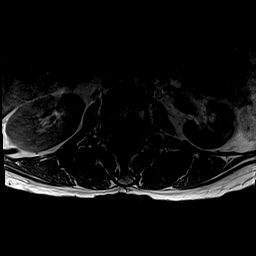

[32 of 48 positions shown; findings below may reference images not displayed]

FINDINGS: COUNT/LABELING: Please note that spinal labeling was performed assuming there are 5 nonrib-bearing lumbar type vertebrae.
ALIGNMENT: Grade 1 retrolisthesis of L1 over L2 and L2 over L3. Grade 1 anterolisthesis of L4 over L5.
VERTEBRAE:  Vertebral body heights are maintained. Multilevel degenerative endplate signal changes.
INTERVERTEBRAL DISCS:  Multilevel disc desiccation with moderate height loss at L1-2 and L2-3.
FACET JOINTS:  Multilevel facet arthropathy, moderate at L1-2 through L3-4 and severe L4-5 and L5-S1.
PARASPINAL SOFT TISSUES:  No paraspinal soft tissue signal abnormalities.
DISTAL THORACIC SPINAL CORD AND CONUS MEDULLARIS:  There is smooth enlargement of the central canal of the spinal cord noted at the T12 level, statistically most likely to be congenital in etiology
but not fully evaluated. The conus medullaris terminating at L2, which is low normal.
CAUDA EQUINA NERVE ROOTS: Unremarkable.
FINDINGS BY LEVEL:
T12-L1: No high-grade canal stenosis or foraminal narrowing.
L1-L2:  Spondylolisthesis, disc bulge, facet arthropathy and endplate spurring. Mild to moderate bilateral foraminal narrowing. No high-grade canal stenosis.
L2-L3:  No high-grade canal stenosis or foraminal narrowing.
L3-L4:  Disc bulge, facet arthropathy and endplate spurring. Mild to moderate bilateral foraminal narrowing. No high-grade canal stenosis.
L4-L5:  Disc bulge, facet arthropathy and endplate spurring. Mild to moderate bilateral foraminal narrowing. No high-grade canal stenosis.
L5-S1:  No high-grade canal stenosis or foraminal narrowing.
OTHER:
*  Moderate left SI joint osteoarthritis.
IMPRESSION: 1.  Mild to moderate bilateral L1-2, L3-4 and L4-5 foraminal narrowing. Findings are similar to prior.
2.  Smooth enlargement of the central canal of the distal spinal cord is statistically most likely to be congenital in etiology. However, whole spinal cord imaging would be helpful to exclude other
pathologies and an MRI of the cervical and thoracic spine without contrast are recommended.
3.  Moderate left SI joint osteoarthritis.

## 2022-03-27 IMAGING — MR MRI TSPINE WO CONTRAST
5 of 6 series · 34 of 48 positions shown · non-contrast
Comparison: Thoracic spine MRI without and with contrast 10/04/2020, thoracic spine radiographs 08/07/2020, thoracic spine MRI without contrast 08/06/2020.

Images Obtained from [facility]
REASON FOR EXAM: Radiculopathy, lumbar region.
TECHNIQUE: Multiplanar, multisequence MR images of the thoracic spine were obtained without intravenous contrast.

[Series 16: t2_sag · sagittal · 3.0mm · 0.83mm/px · 5 of 19 slices shown]
[im 1/19]
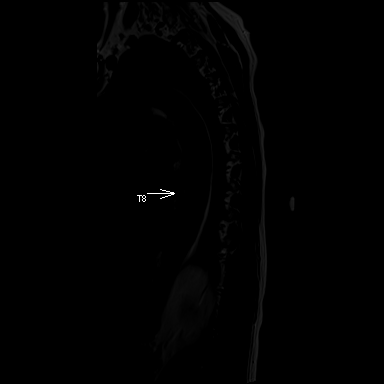
[im 5/19]
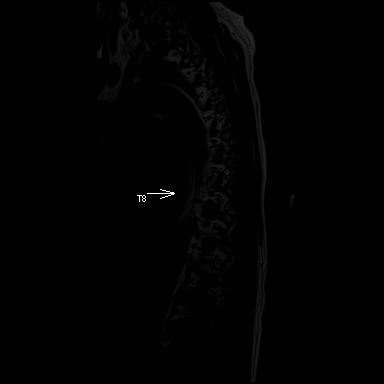
[im 10/19]
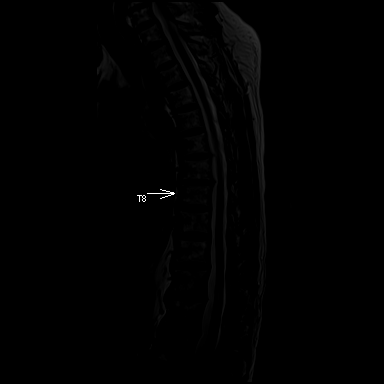
[im 14/19]
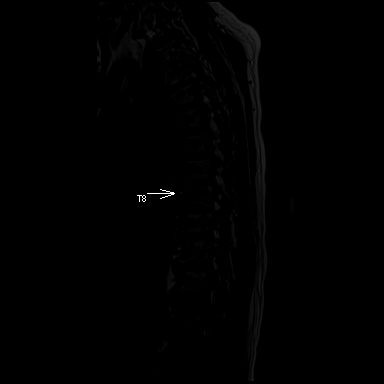
[im 19/19]
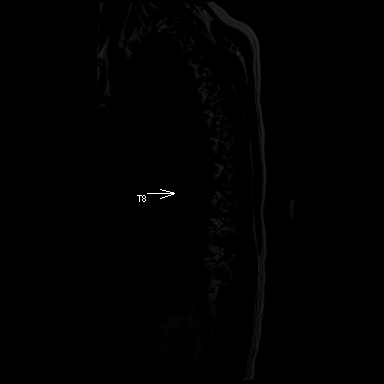

[Series 17: t1_sag · sagittal · 3.0mm · 0.83mm/px · 5 of 19 slices shown]
[im 1/19]
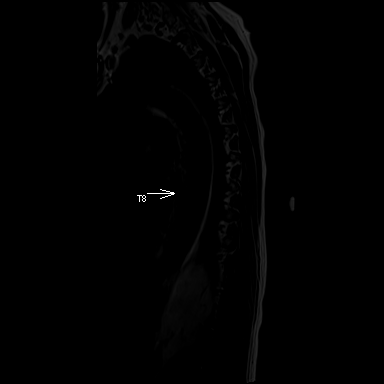
[im 5/19]
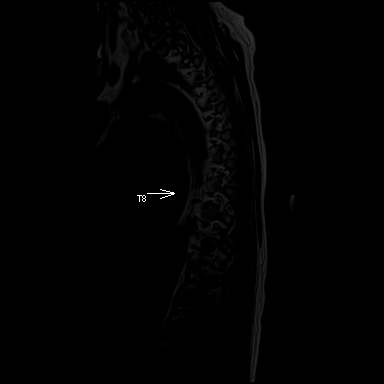
[im 10/19]
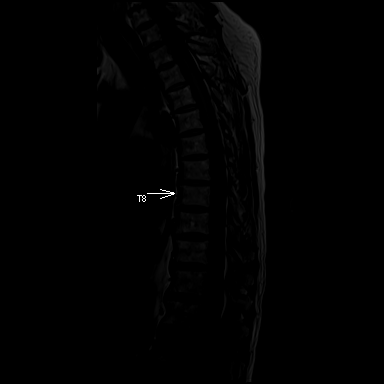
[im 14/19]
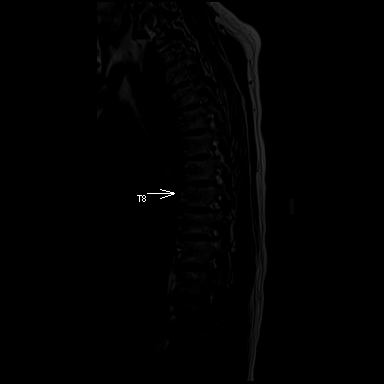
[im 19/19]
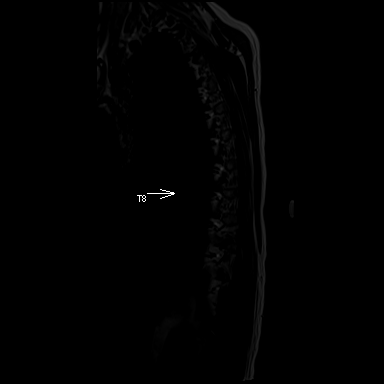

[Series 18: ir_sag · sagittal · 3.0mm · 1.25mm/px · 5 of 19 slices shown]
[im 1/19]
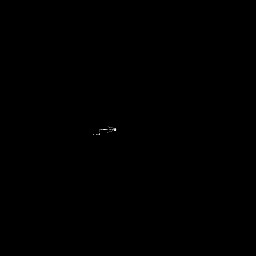
[im 5/19]
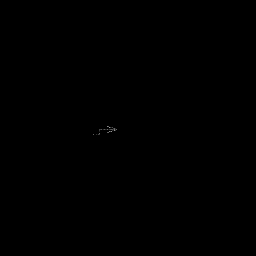
[im 10/19]
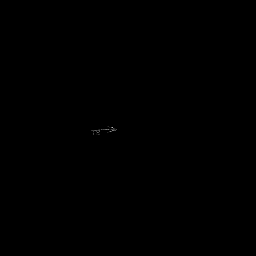
[im 14/19]
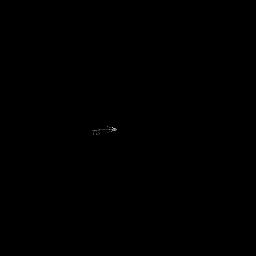
[im 19/19]
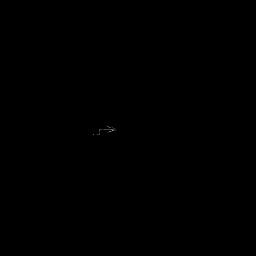

[Series 19: t2_axial · axial · 3.5mm · 0.36mm/px · z∈[-185,-39]mm · 9 of 34 slices shown (1 of 2)]
[im 1/34]
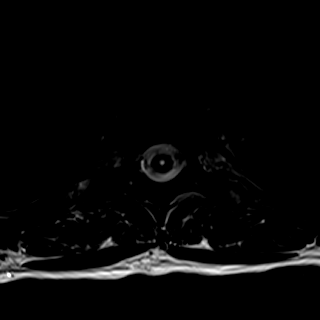
[im 5/34]
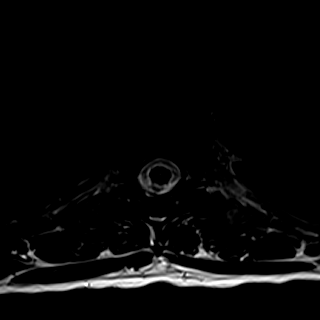
[im 9/34]
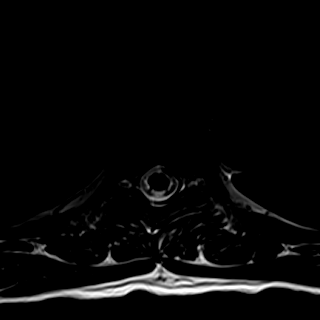
[im 13/34]
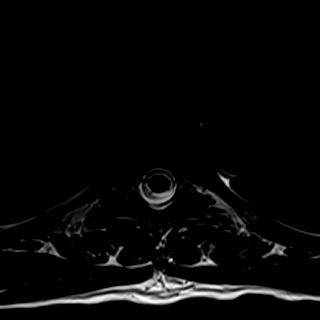
[im 17/34]
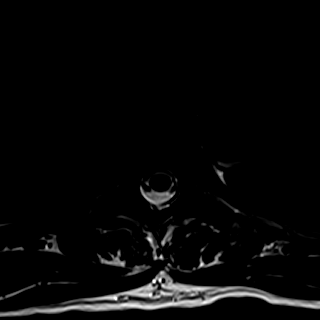
[im 21/34]
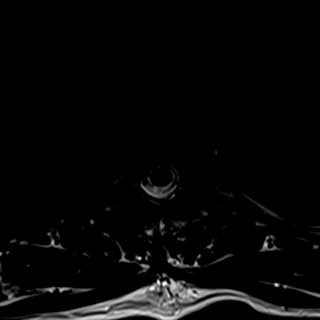
[im 25/34]
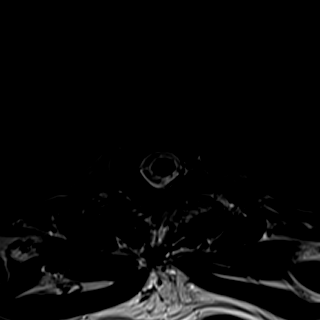
[im 29/34]
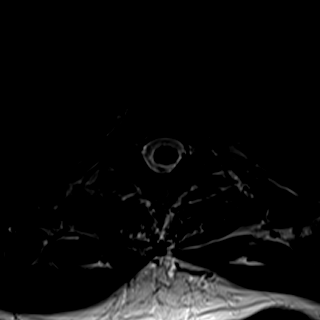
[im 34/34]
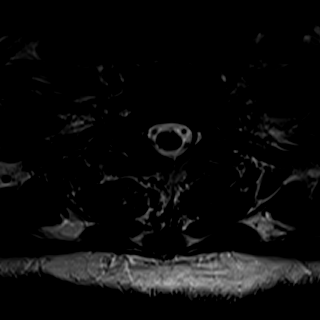

[Series 20: t2_axial · axial · 3.5mm · 0.41mm/px · z∈[-316,-159]mm · 10 of 36 slices shown (2 of 2)]
[im 1/36]
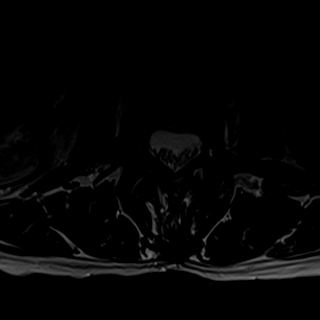
[im 4/36]
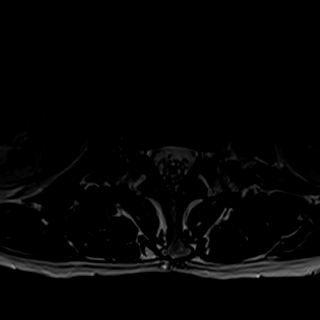
[im 8/36]
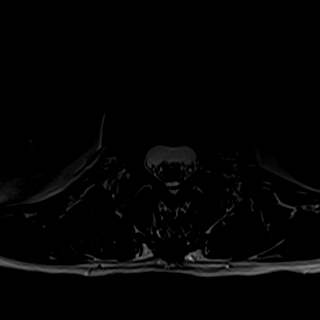
[im 12/36]
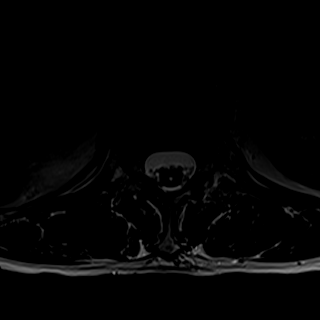
[im 16/36]
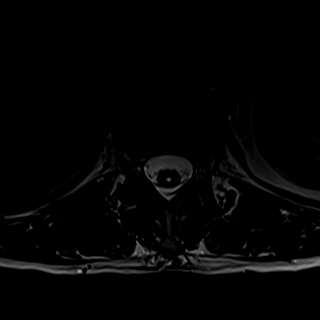
[im 20/36]
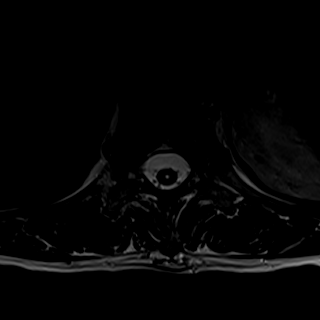
[im 24/36]
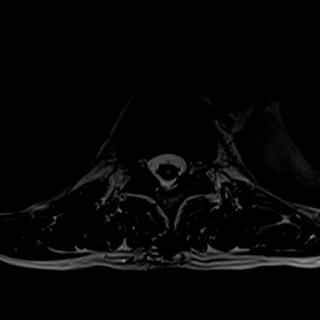
[im 28/36]
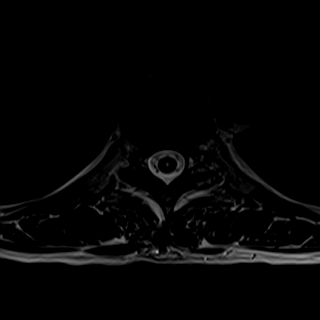
[im 32/36]
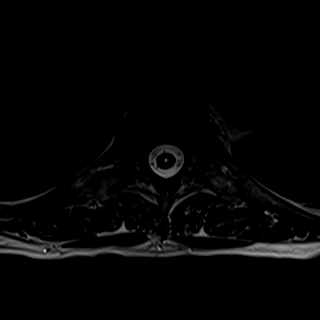
[im 36/36]
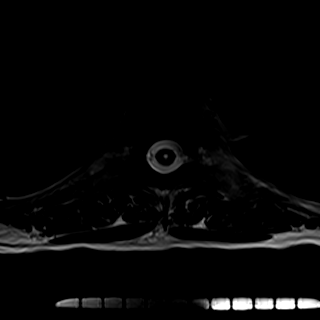

[34 of 48 positions shown; findings below may reference images not displayed]

FINDINGS: COUNT/LABELING: Please note that spinal labeling was performed assuming there are 12 rib-bearing thoracic vertebrae.
ALIGNMENT: No subluxations.
VERTEBRAE:  Heterogeneous bone marrow signal may be related to bone demineralization. Vertebral body heights are maintained. Minimal degenerative endplate signal changes. T1 and T2 hyperintense
intraosseous lesions most likely represent intraosseous hemangiomas.
INTERVERTEBRAL DISCS:  Multilevel disc desiccation without height loss.
PARASPINAL SOFT TISSUES:  No paraspinal soft tissue signal abnormalities.
SPINAL CORD:  Redemonstration of areas of smooth spinal cord central canal enlargement from T8 through T12, most likely congenital in etiology. No spinal cord compression.
FINDINGS BY LEVEL:
No high-grade canal stenosis or foraminal narrowing in the thoracic spine.
IMPRESSION: Unchanged area of smooth central spinal cord enlargement in the mid to distal thoracic cord. This is felt to be congenital in etiology and in the absence of myelopathy this is felt to unlikely cause

## 2022-04-23 IMAGING — MG MAMMO SCRN BIL W/CAD TOMO
8 series · 8 of 24 positions shown · non-contrast
Comparison: The present examination has been compared to prior imaging studies.

Images Obtained from [facility]
INDICATION: Screening.
TECHNIQUE: Bilateral 2-D digital screening mammogram was performed followed by 3-D tomosynthesis.  Current study was also evaluated with a computer aided detection (CAD) system.
MAMMOGRAM FINDINGS:
The breasts are extremely dense, which lowers the sensitivity of mammography.
No suspicious abnormality is seen in either breast.  There are no significant changes from the prior study.

[L CC]
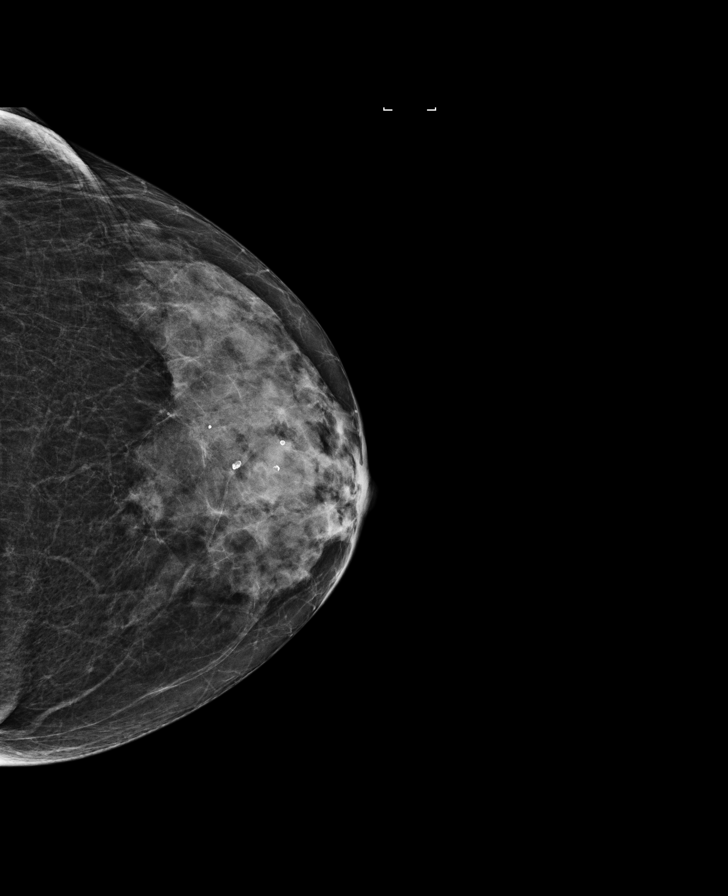

[R CC]
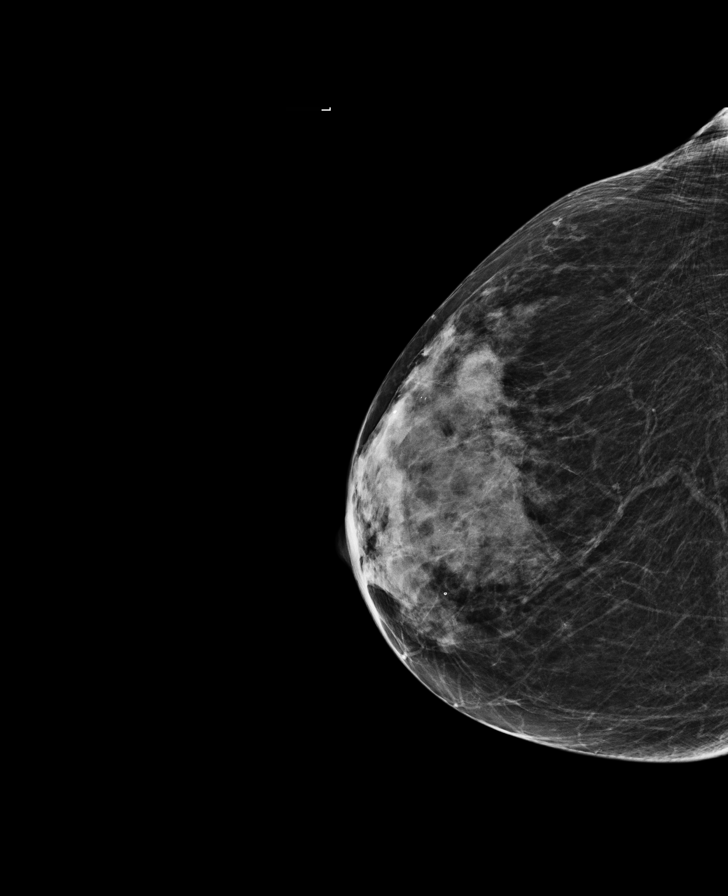

[R MLO]
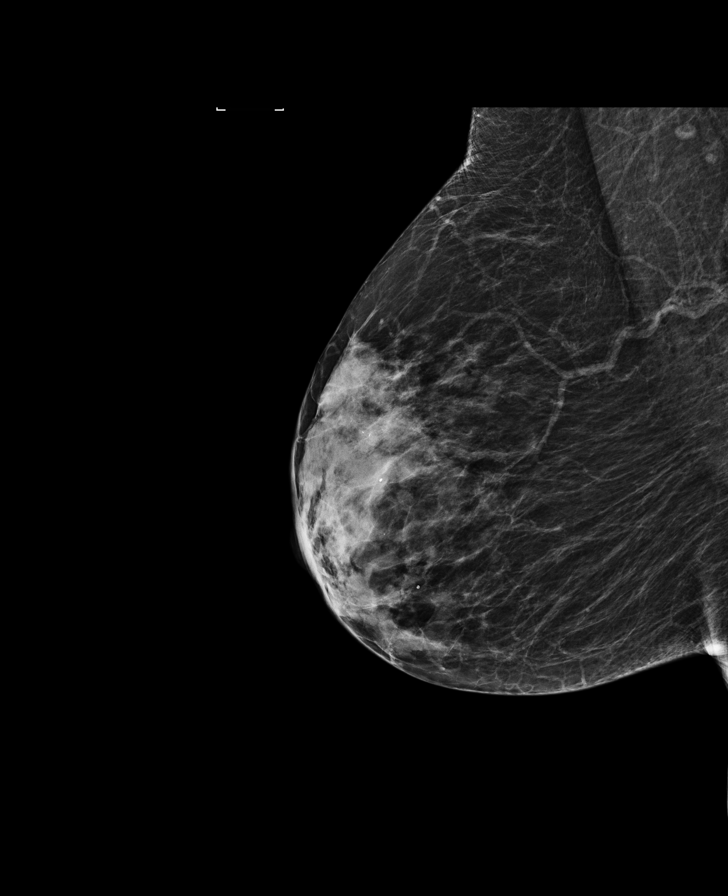

[L MLO]
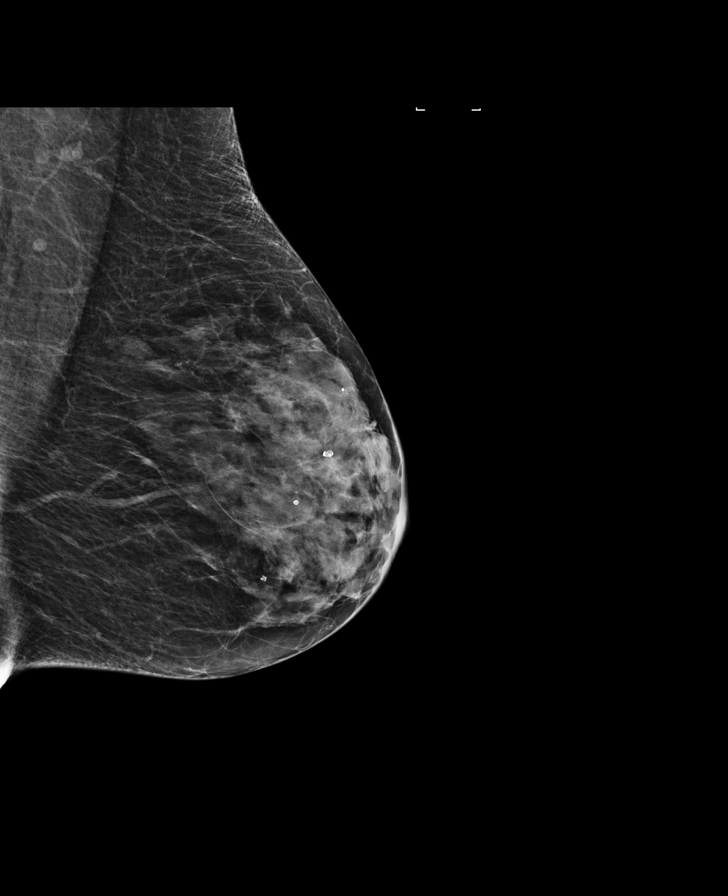

[L MLO tomo · tomo slice 20/39.0]
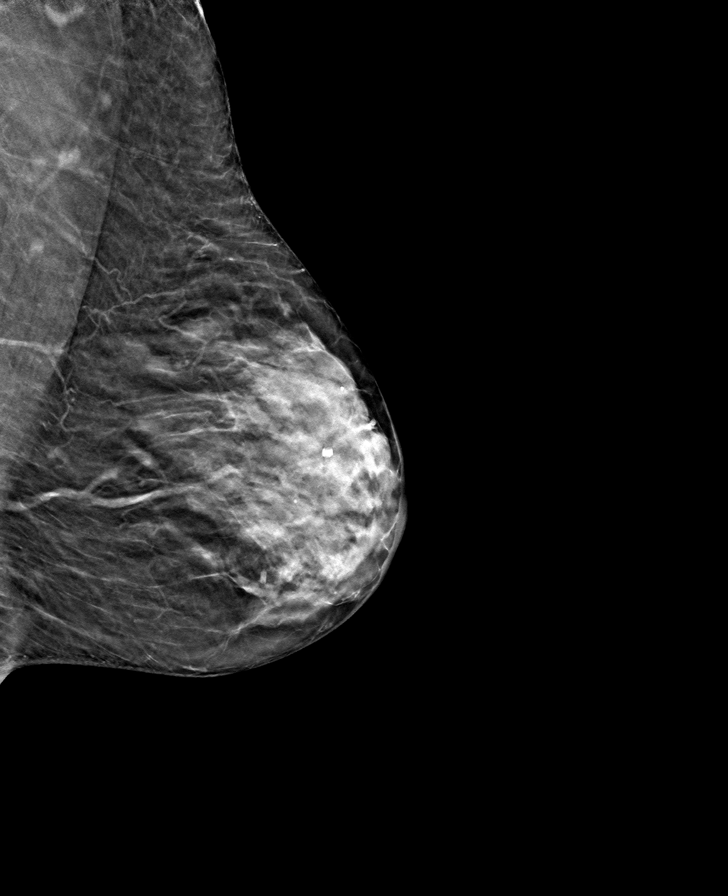

[L CC tomo · tomo slice 19/37.0]
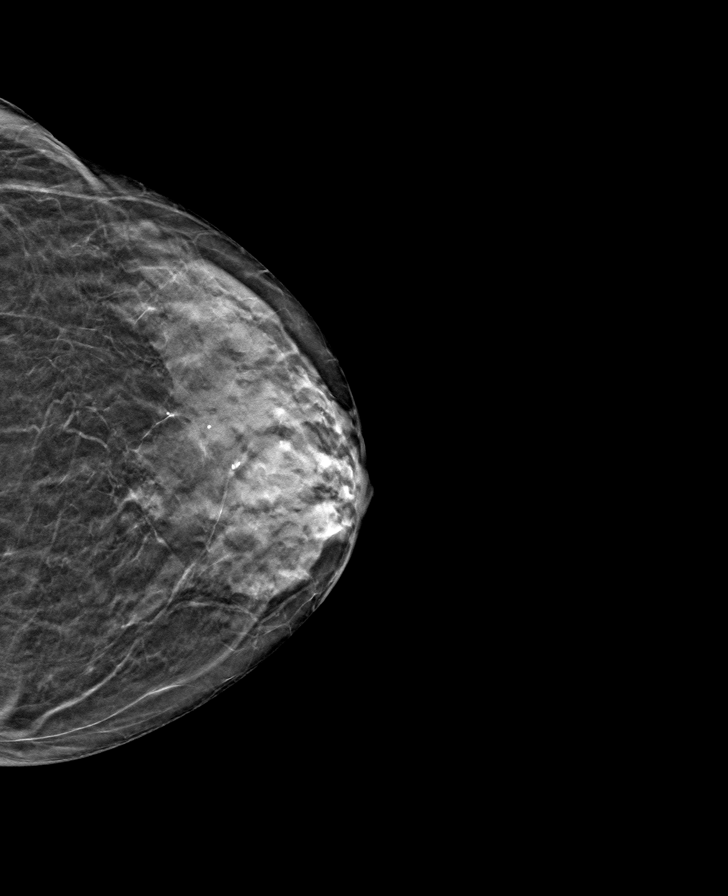

[R CC tomo · tomo slice 21/40.0]
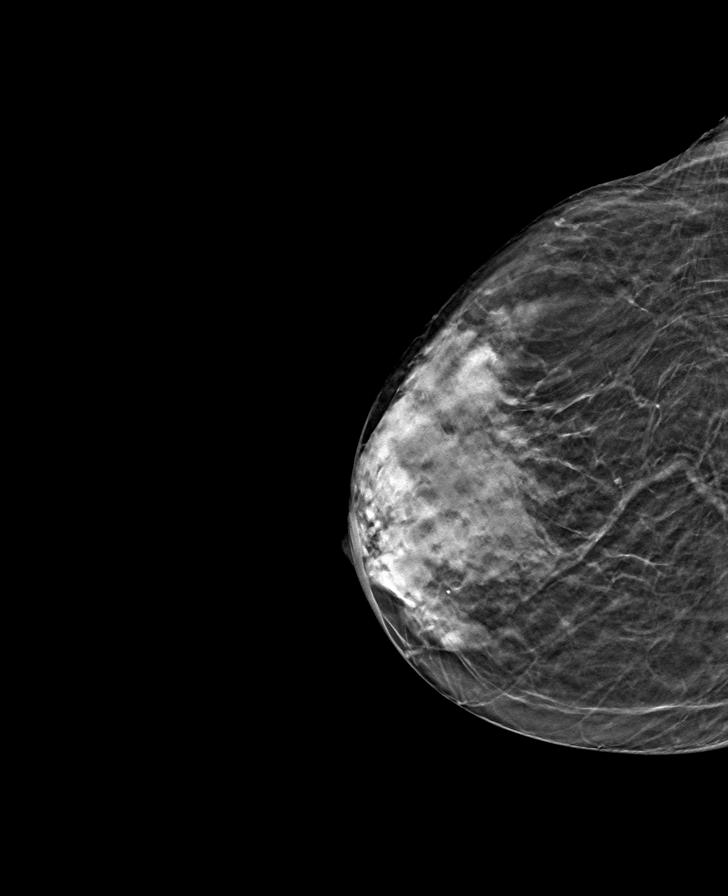

[R MLO tomo · tomo slice 21/40.0]
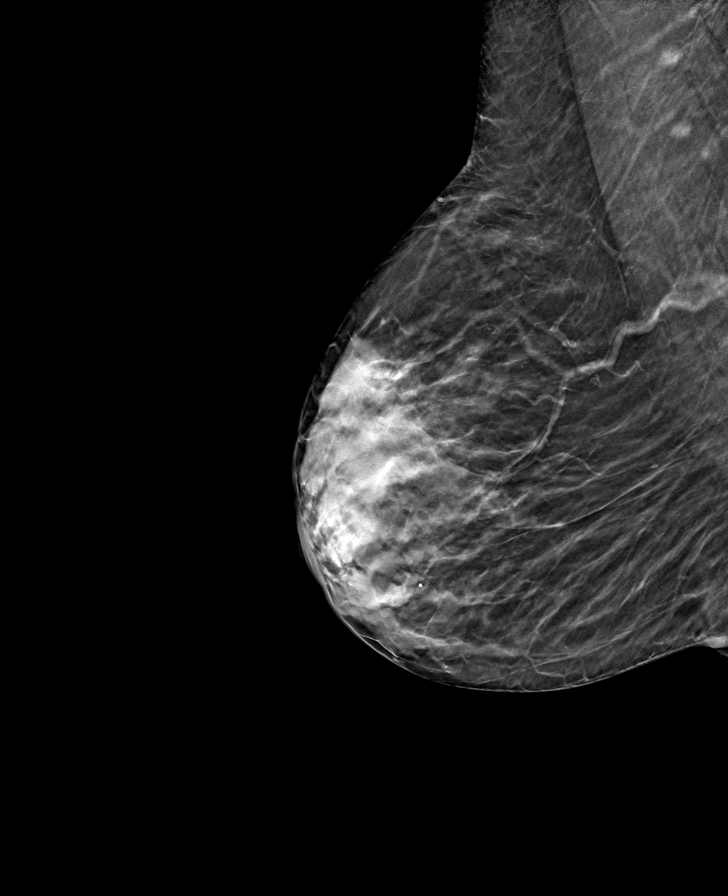

[8 of 24 positions shown; findings below may reference images not displayed]

IMPRESSION: There is no mammographic evidence of malignancy.
Screening mammogram recommended in 1 year.
BI-RADS Category 1: Negative

## 2022-07-18 IMAGING — CR ANKLE RT 3 VWS MIN
1 series · 3 of 3 positions shown · non-contrast
Comparison: None

Images Obtained from [facility]
Right ankle radiographs, 3 views
INDICATION: Right ankle pain

[Series 1: lat · 0.17mm/px · 3 of 3 slices shown]
[im 1/3]
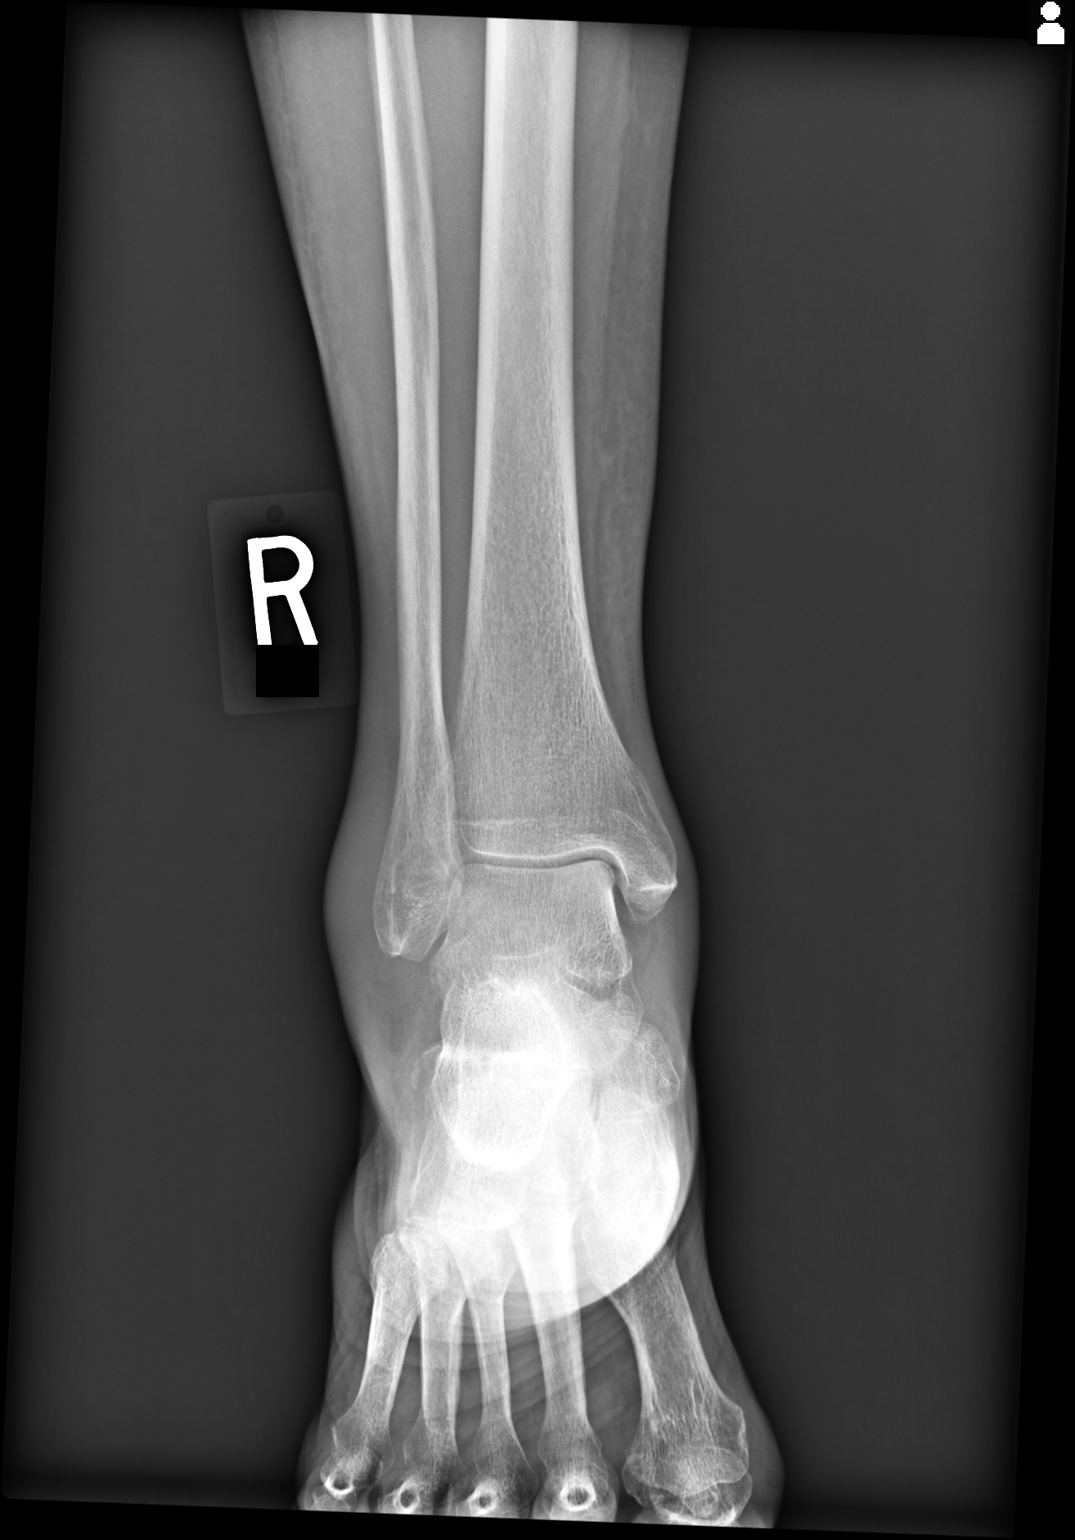
[im 2/3]
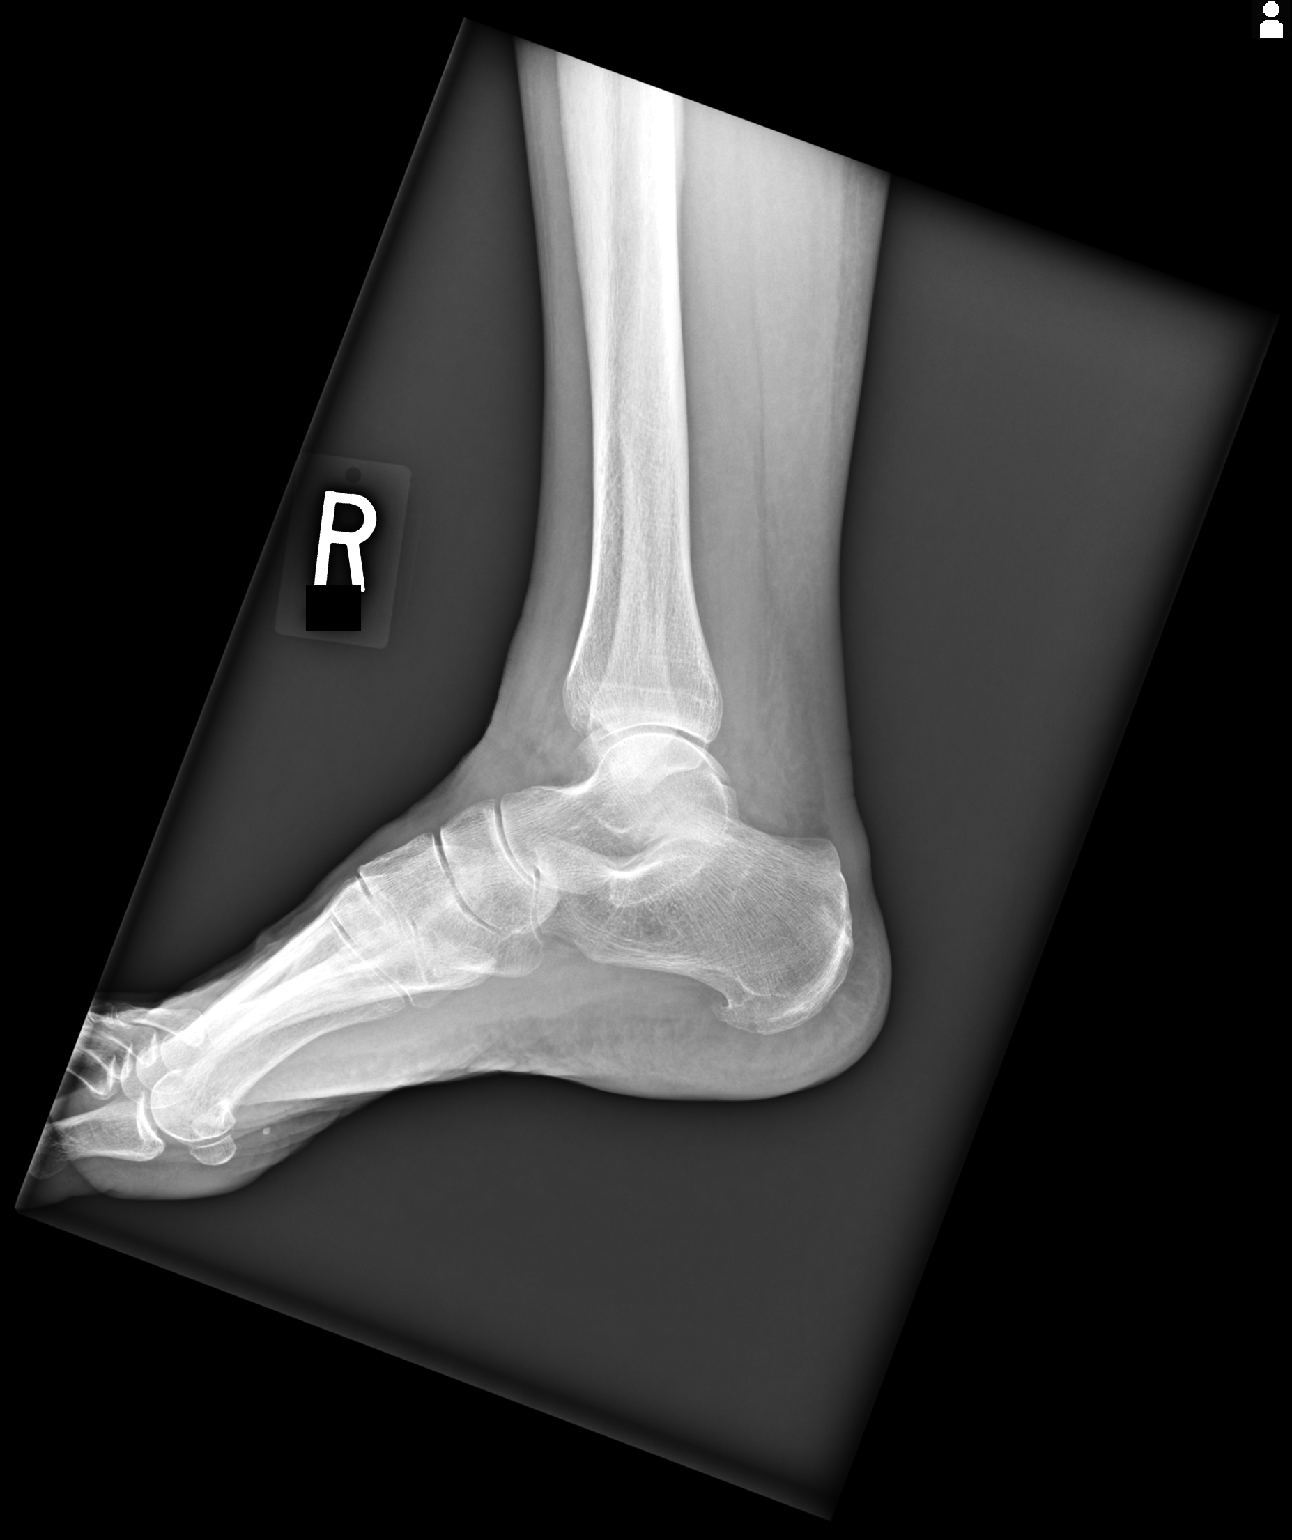
[im 3/3]
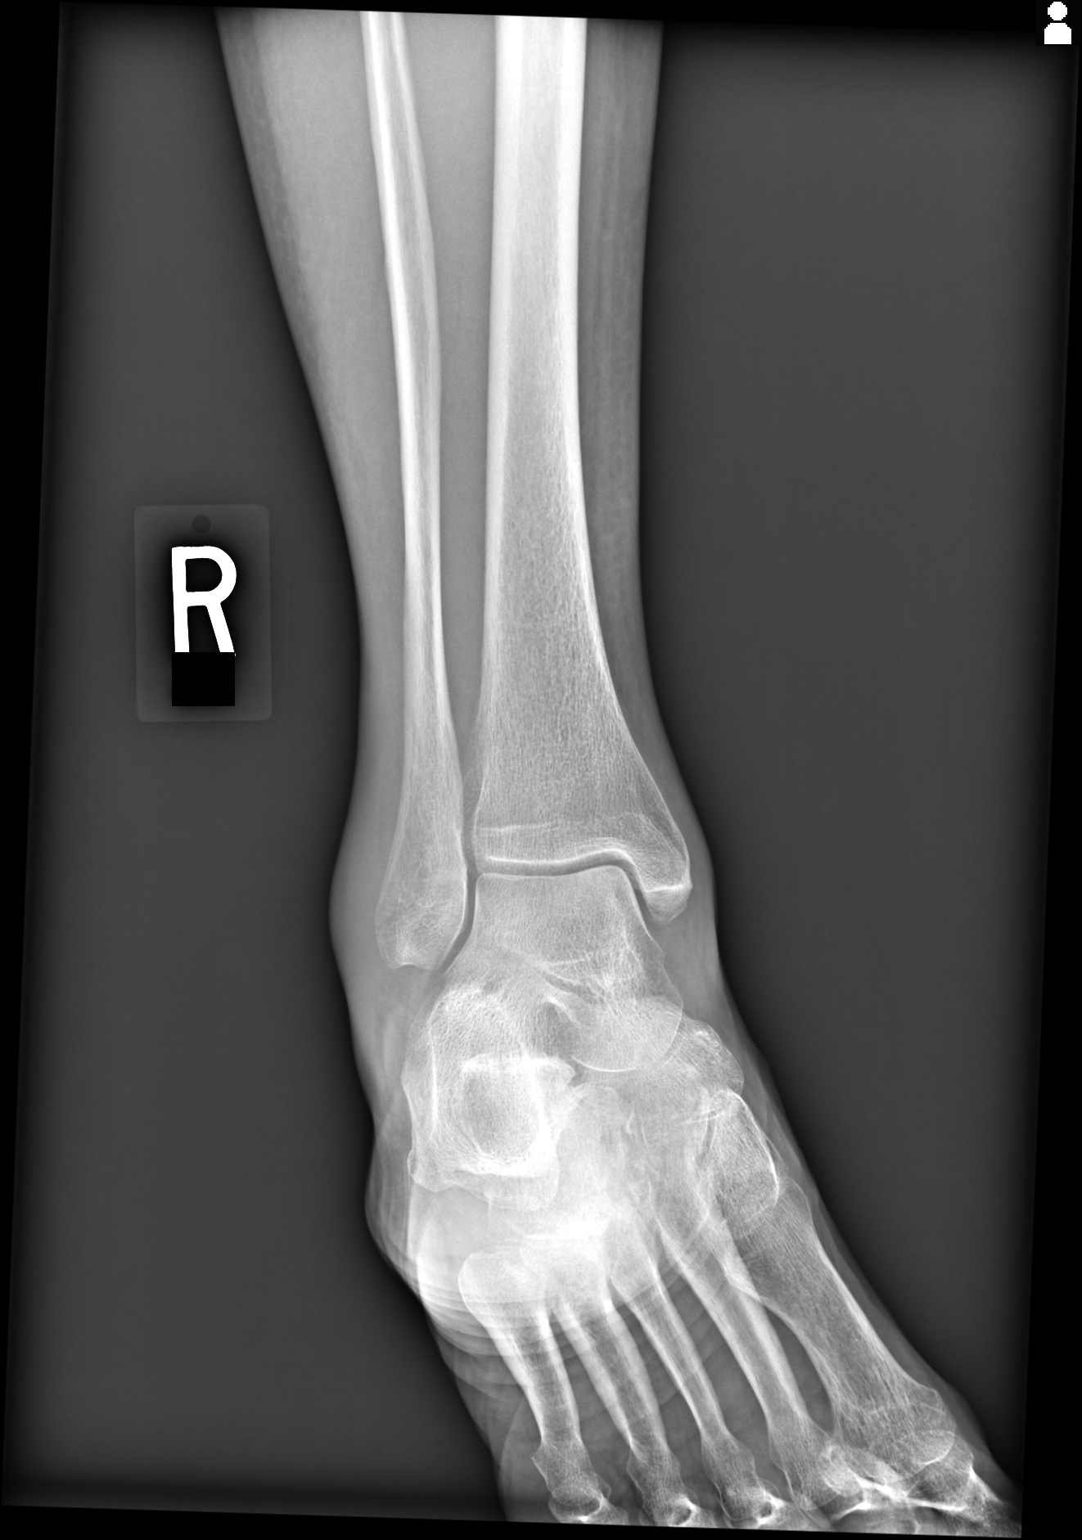

[3 of 3 positions shown; findings below may reference images not displayed]

FINDINGS: Questionable nondisplaced fracture of the lateral malleolus with mild overlying soft tissue swelling. No additional fractures. Moderate tibiotalar joint effusion. Small plantar calcaneal enthesophyte.
IMPRESSION: Questionable nondisplaced fracture of the lateral malleolus. Further evaluation with noncontrast MRI of the right ankle is recommended.

## 2022-07-18 IMAGING — CR FOOT RT 3 VWS MIN
1 series · 3 of 3 positions shown · non-contrast
Comparison: none

[Series 1: lat · 0.17mm/px · 3 of 3 slices shown]
[im 1/3]
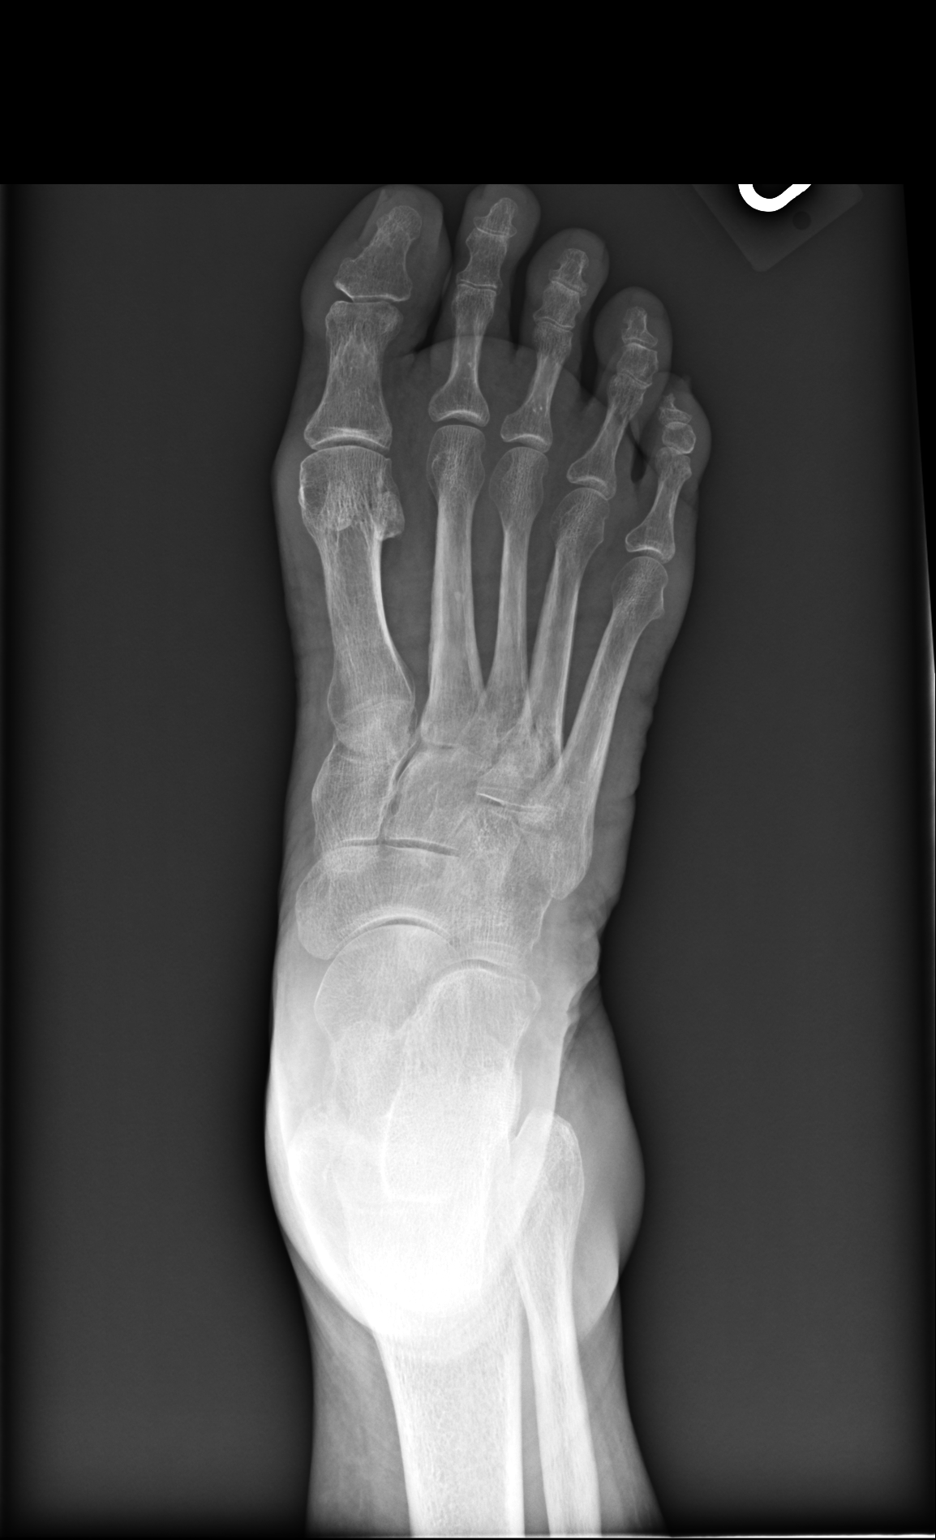
[im 2/3]
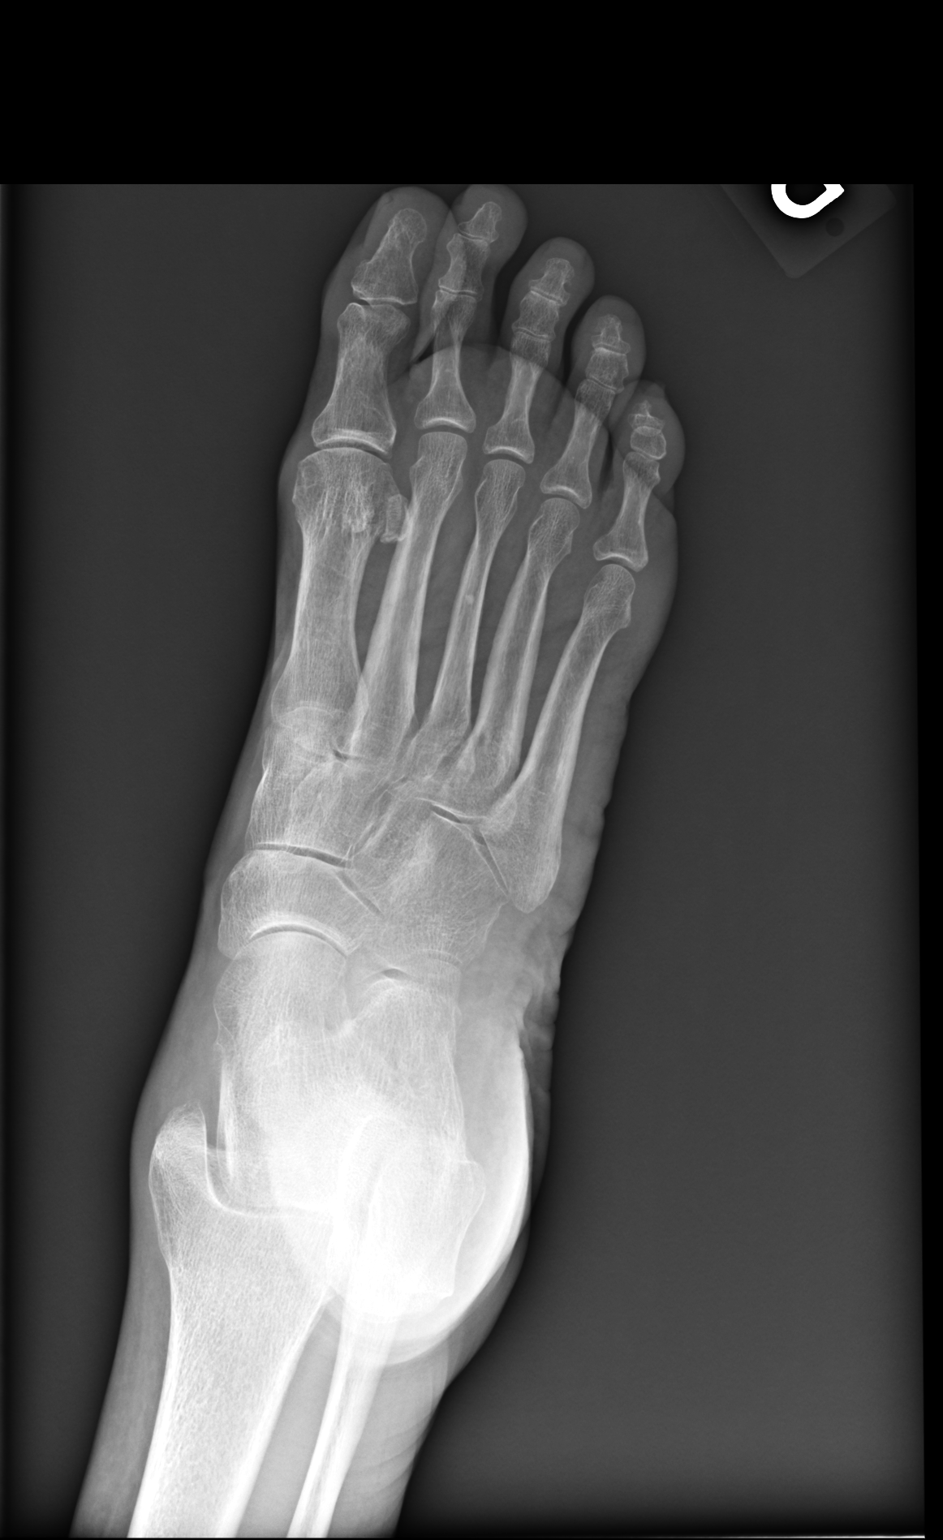
[im 3/3]
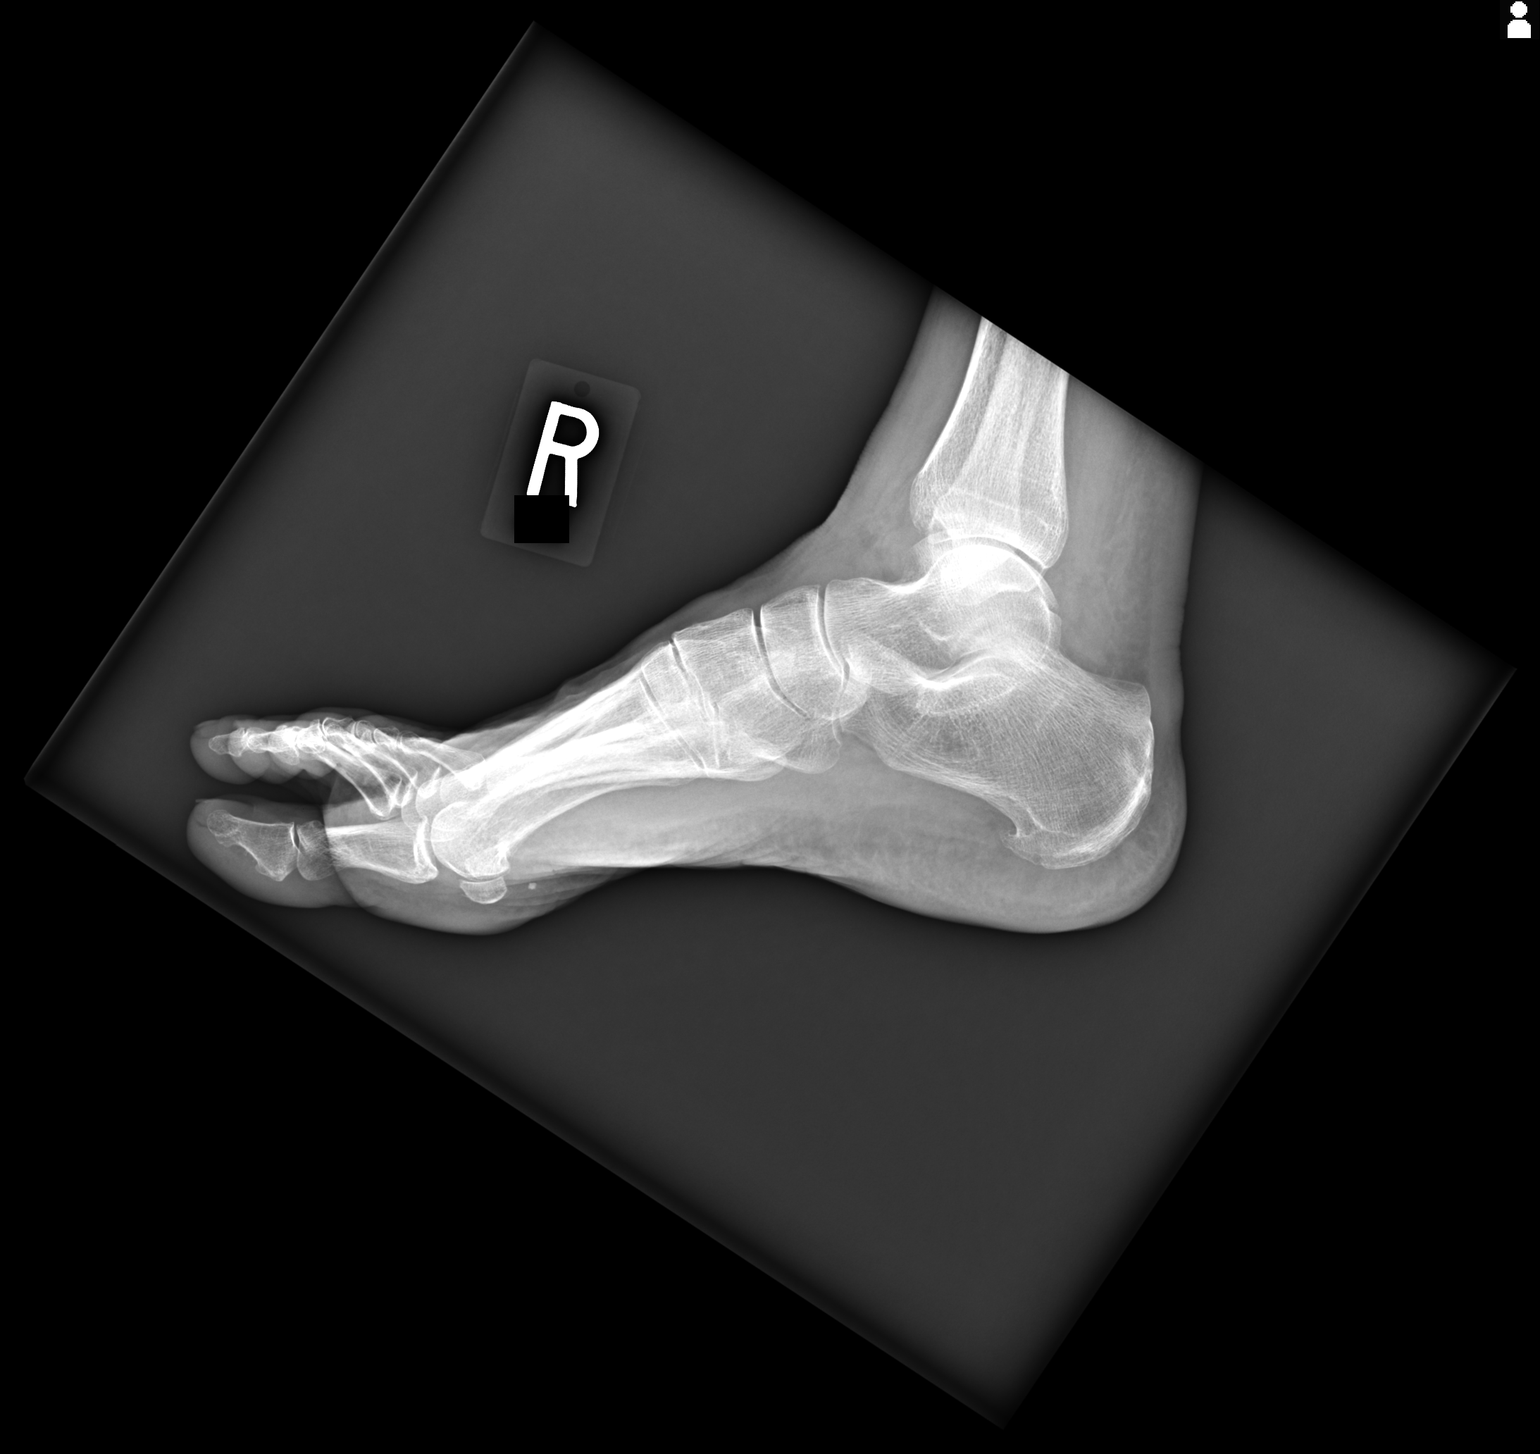

[3 of 3 positions shown; findings below may reference images not displayed]

Images Obtained from [facility]
REASON FOR EXAM
*  Right foot pain
COMPARISON
*  None
TECHNIQUE
*  Right foot, 3 views
FINDINGS
*  Demineralized bones
*  No acute or healing fracture of the foot bones
*  No widening of the Lisfranc interval
*  No periosteal reaction or destructive osseous lesion
*  Plantar calcaneal enthesophytes
*  No radiopaque foreign body
*  Soft tissue swelling about the lateral ankle
IMPRESSION
*  No acute osteoarticular findings in the right foot

## 2023-04-13 IMAGING — CR ABDOMEN  KUB ONLY
1 series · 2 of 2 positions shown · non-contrast
Comparison: None

Images Obtained from [facility]
Abdominal radiograph, one view
INDICATION: - Acute gastritis without bleeding

[Series 1: AP · 0.17mm/px · 2 of 2 slices shown]
[im 1/2]
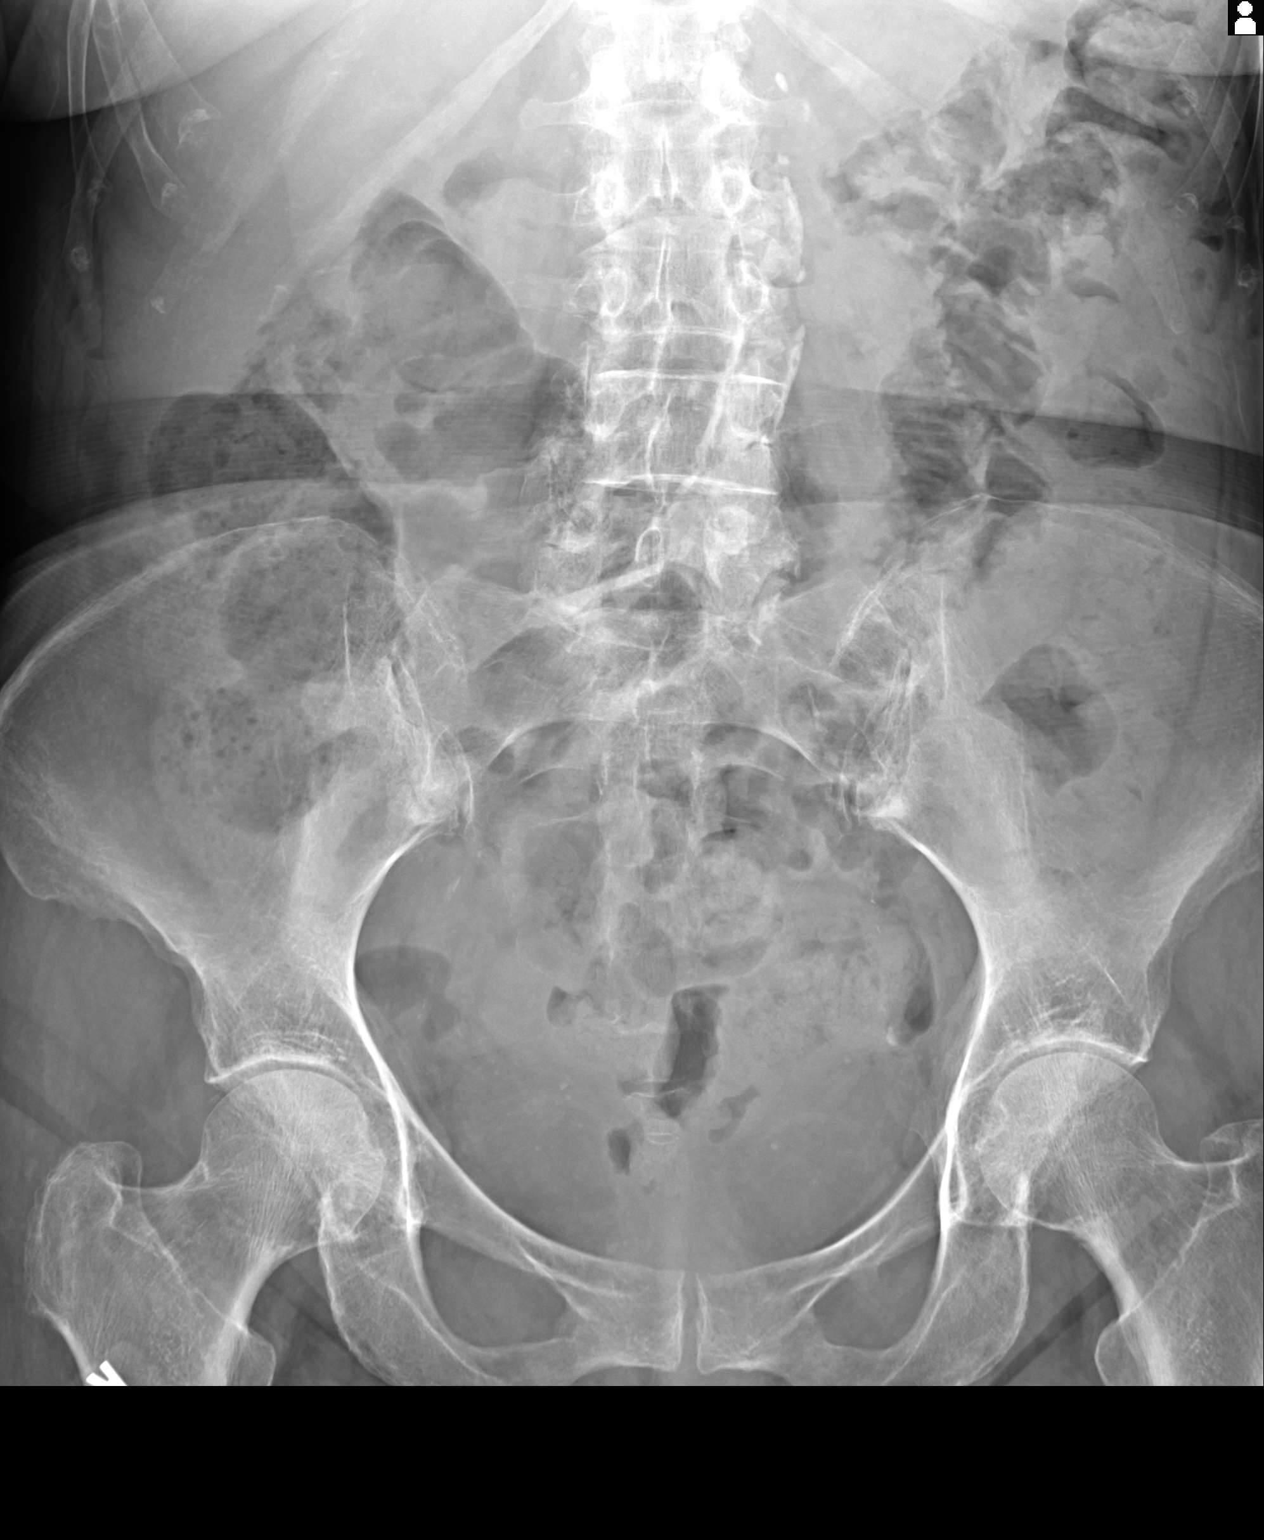
[im 2/2]
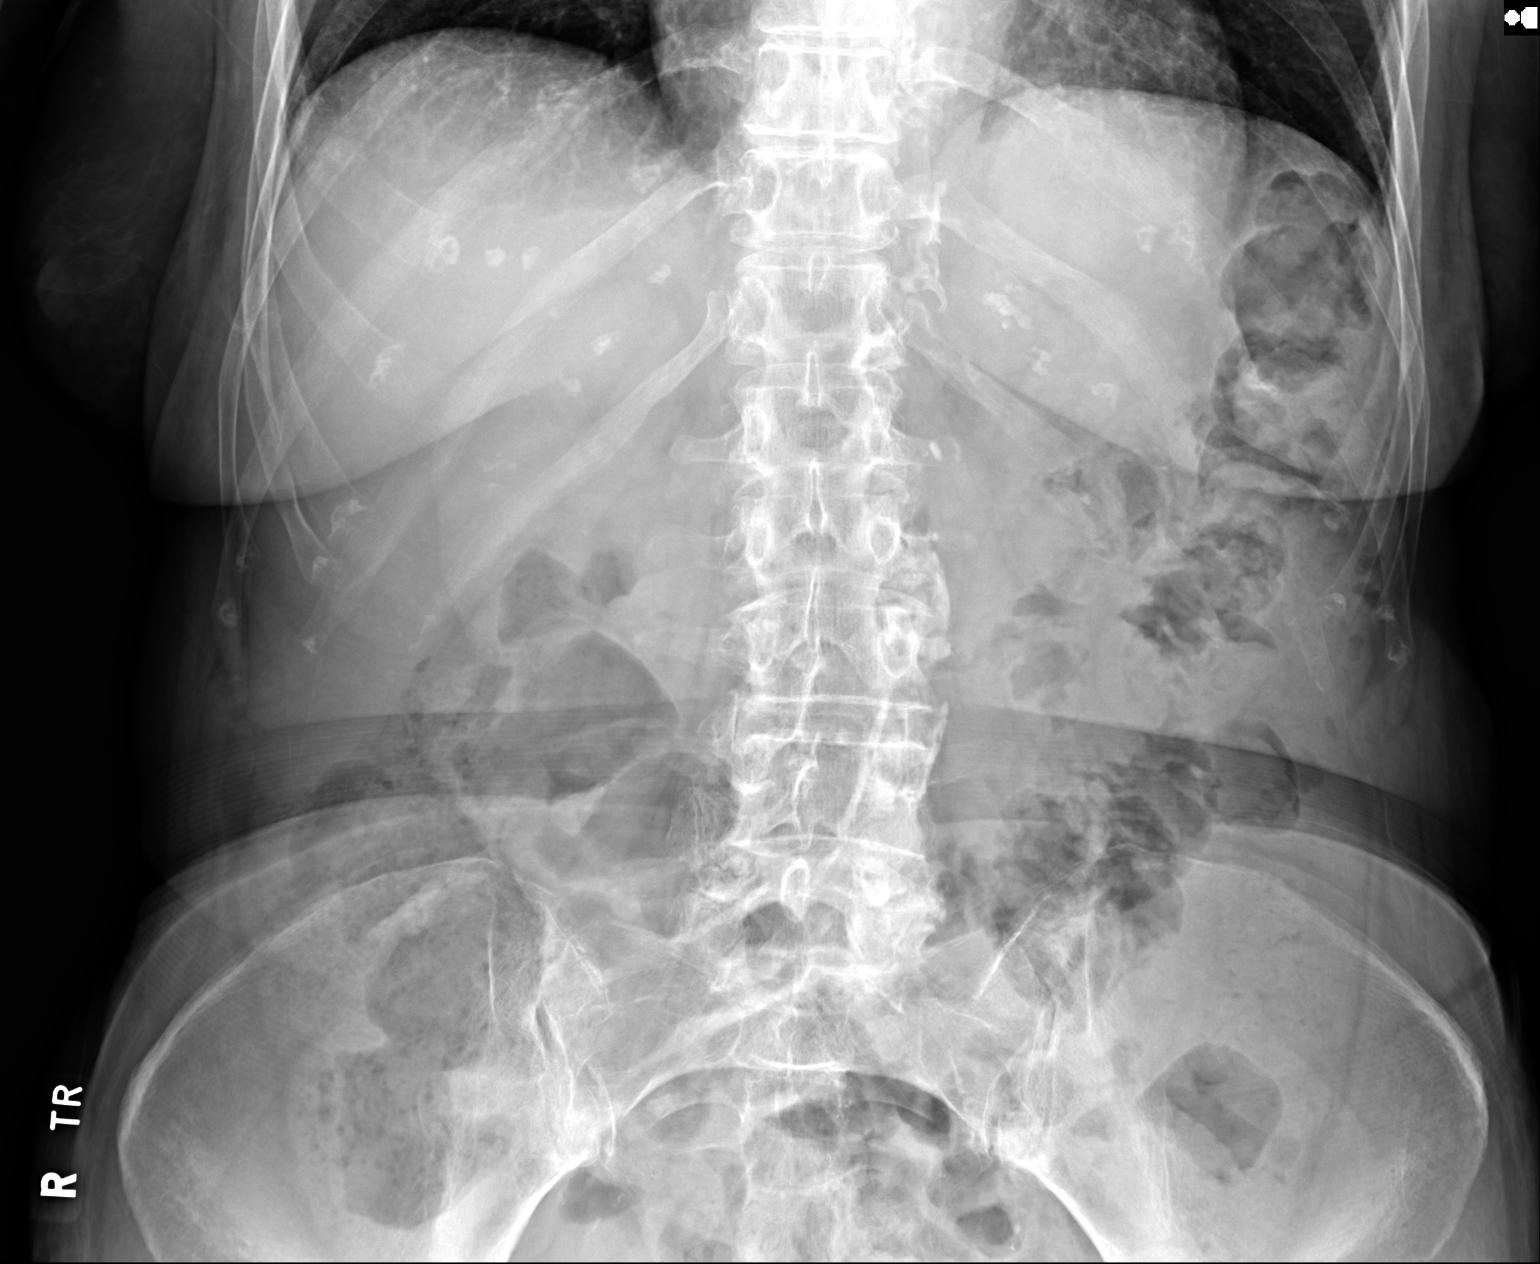

[2 of 2 positions shown; findings below may reference images not displayed]

FINDINGS: No free air. No bowel obstruction. Few tiny calcifications project over both kidneys, possible nephrolithiasis. No constipation. Moderate calcification of the abdominal aorta. Lung bases are clear.
IMPRESSION: 1.  No acute abdominal findings. No constipation.
2.  Few tiny calcifications project over both kidneys, possible nephrolithiasis.

## 2023-04-23 IMAGING — CT CT ABD/PEL WO/W CONT
2 of 5 series · 11 of 46 positions shown, 12 images · IV contrast (isovue)
Comparison: MRCP 09/24/2020

Images Obtained from [facility]
HISTORY: 77 years-old Female with right upper quadrant pain.
TECHNIQUE: CT abdomen and pelvis study was performed without and with IV contrast using axial images. Sagittal and coronal reconstructed images were obtained. Dose reduction technique used: Automated
exposure control and adjustment of the mA and/or kV according to patient size.
CT Studies and Cardiac Nuclear Medicine Studies in last 12-months = 1
IV contrast: 100mL, Isovue 300.
I-STAT: 1.1 mg/dl.

[Series 2: pre contrast · axial · non-contrast · 0.47mm/px · z∈[+1275,+1625]mm · 8 of 72 slices shown, 9 images]
[im 6/72  soft-tissue]
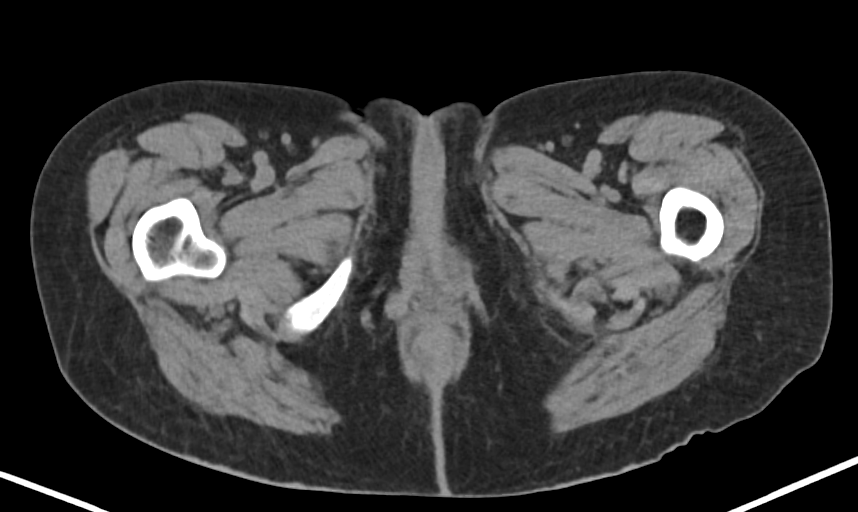
[im 6/72  bone]
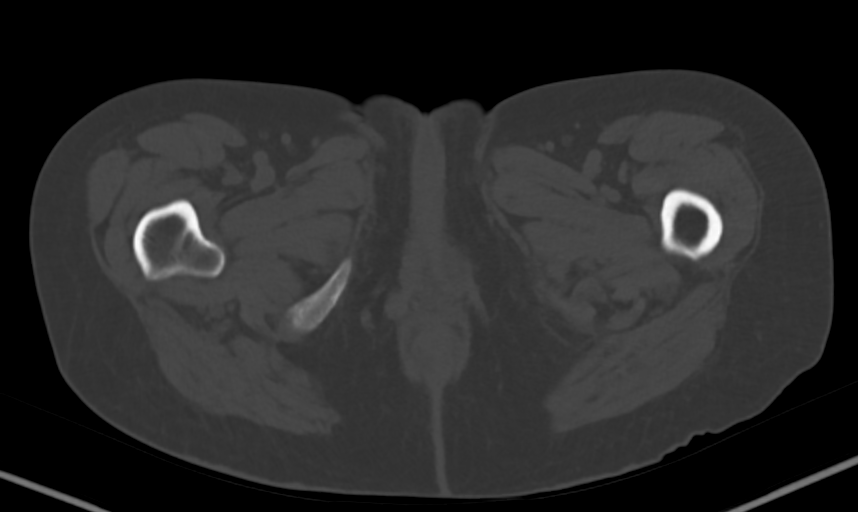
[im 17/72  soft-tissue]
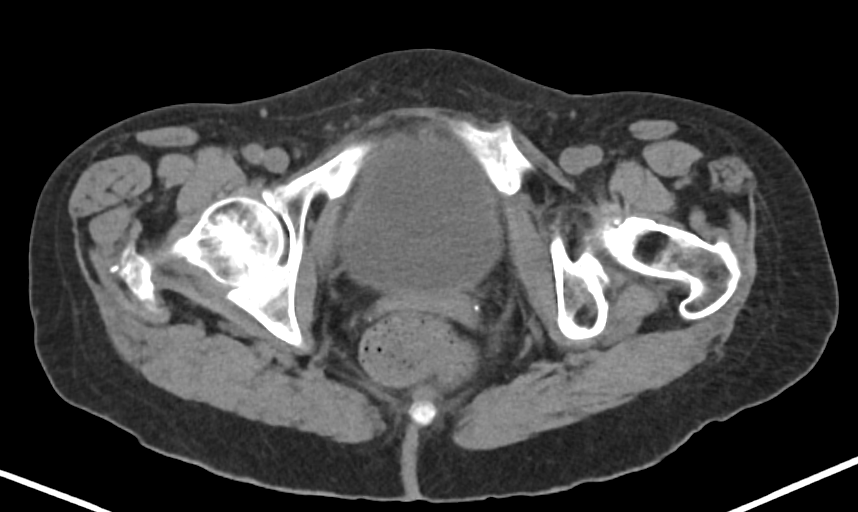
[im 22/72  soft-tissue]
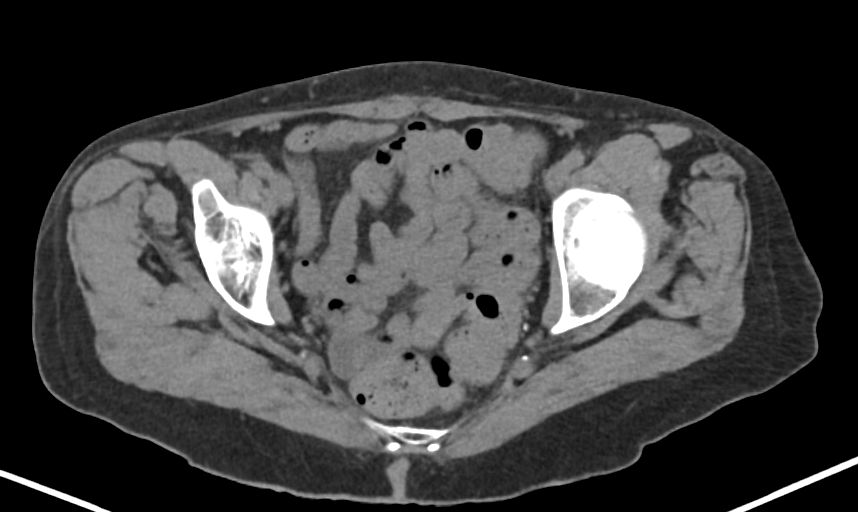
[im 33/72  soft-tissue]
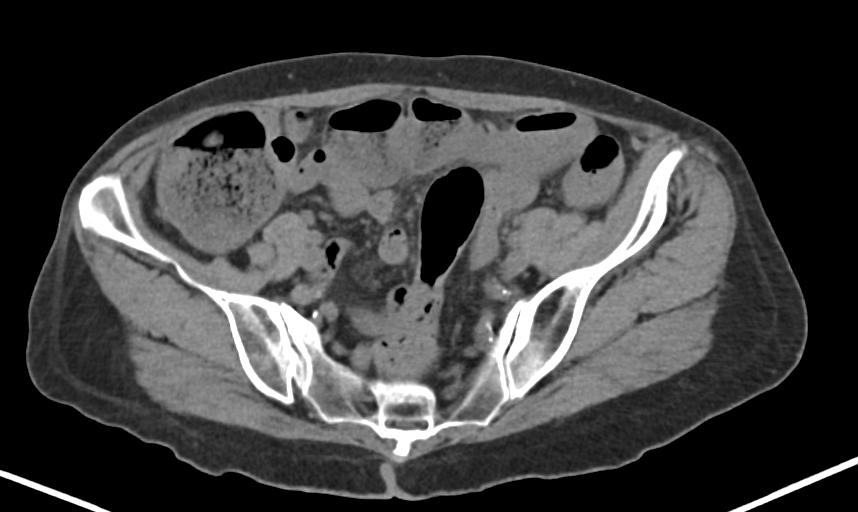
[im 39/72  soft-tissue]
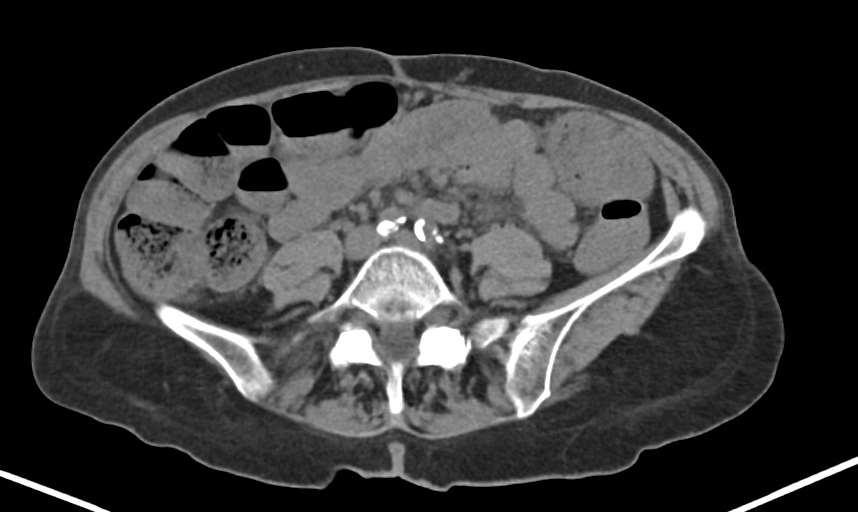
[im 50/72  soft-tissue]
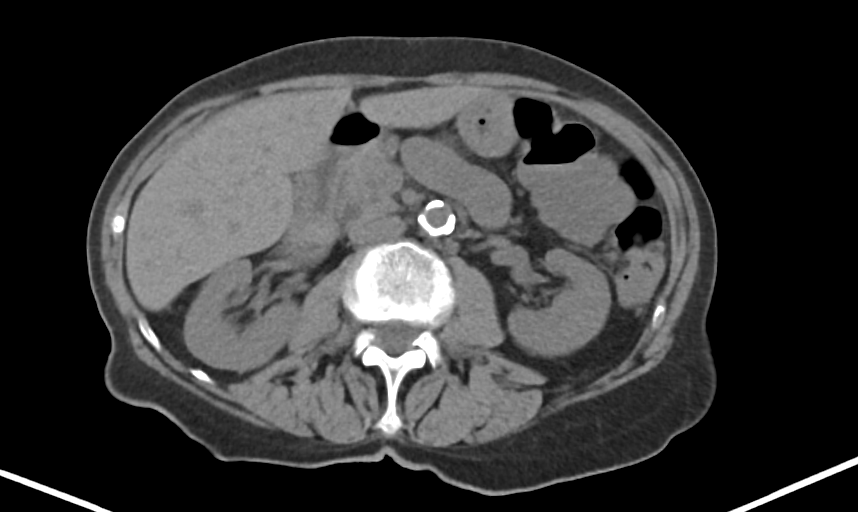
[im 55/72  soft-tissue]
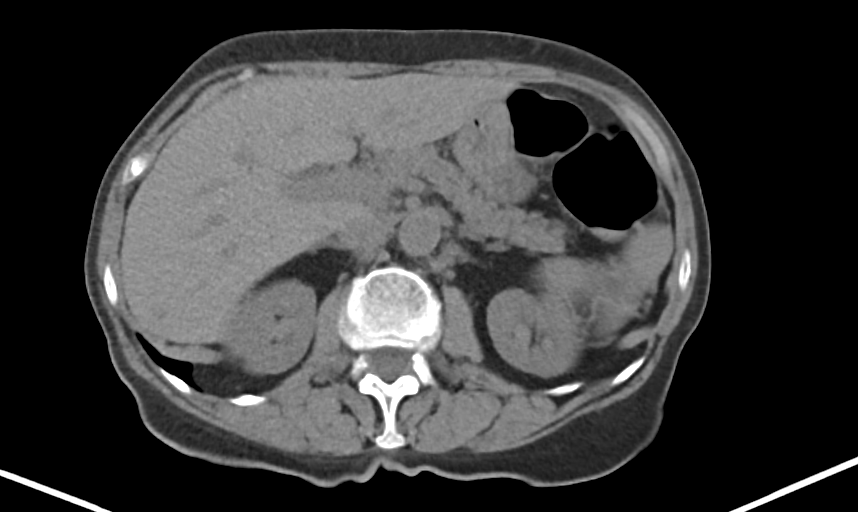
[im 66/72  soft-tissue]
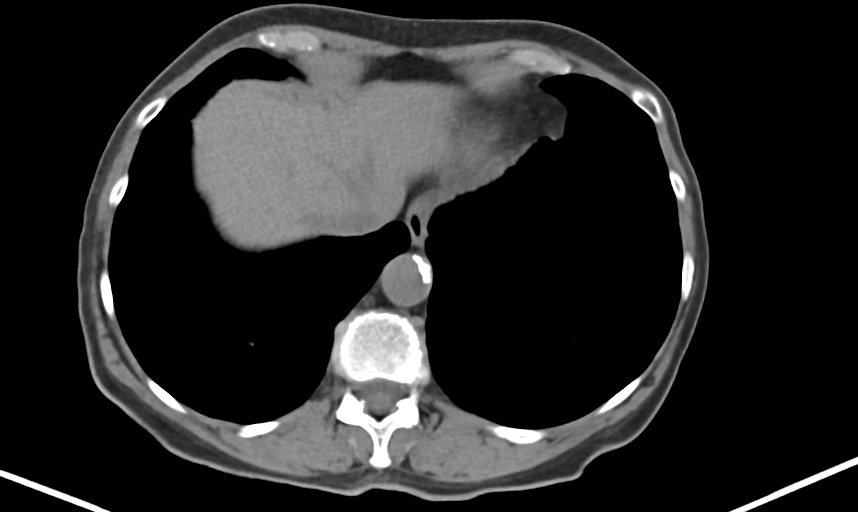

[Series 6: coronal · coronal · 0.79mm/px · 3 of 47 slices shown]
[im 16/47  soft-tissue]
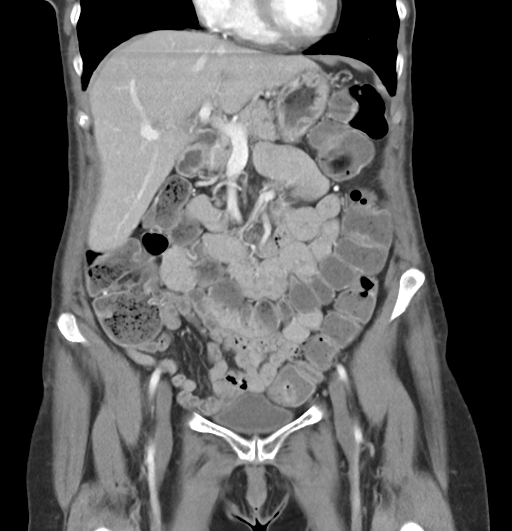
[im 21/47  soft-tissue]
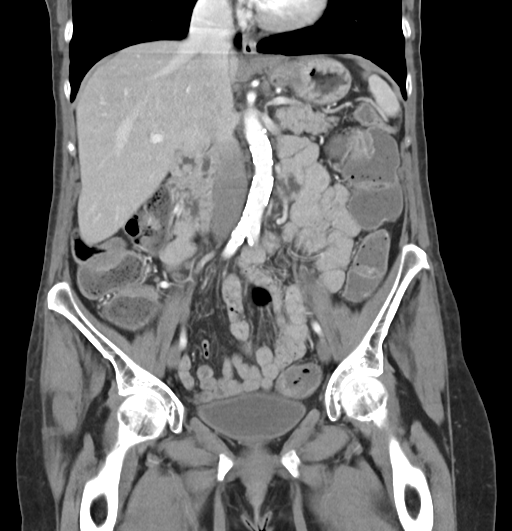
[im 26/47  soft-tissue]
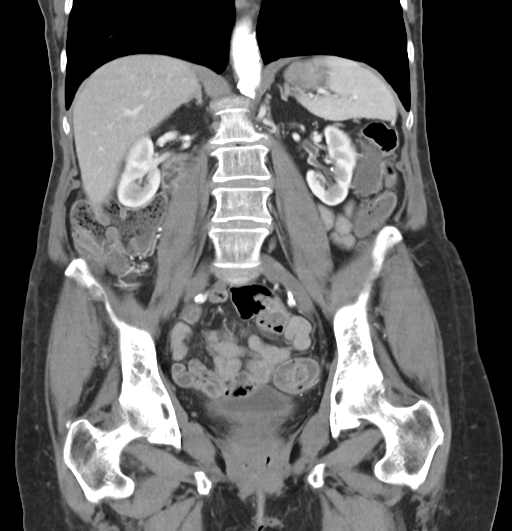

[11 of 46 positions shown; findings below may reference images not displayed]

FINDINGS: Scout: Unremarkable.
Visualized lower thorax: No significant abnormality.
Liver: 8 mm enhancing focus in the right lower, unchanged, likely a flash filling hemangioma ([DATE]).
Gallbladder/bile ducts: Cholecystectomy. Unchanged dilation of the common bile duct measuring up to 11 mm.
Pancreas: No mass or inflammation.
Spleen: No focal lesion or splenomegaly.
Stomach: Unremarkable.
Adrenal glands: No mass.
Kidneys and collecting system: Small nonobstructive bilateral renal stones measuring 1 mm in the right kidney and 2 mm in the left kidney. No hydronephrosis.
Bowel: No bowel wall thickening, mass, obstruction or inflammation. Large amount of stool in the colon. The appendix is not visualized, however, there are no inflammatory changes in the right lower
quadrant to suggest acute appendicitis.
Peritoneum and retroperitoneum: Unremarkable.
Lymphadenopathy: None.
Vasculature: Scattered atherosclerosis. No aneurysm.
Pelvis: Nonvisualized uterus. No adnexal mass. Unremarkable urinary bladder.
Visualized osseous structures: No suspicious lesion or acute fracture. Mild lumbar levoscoliosis with degenerative changes in the spine and pelvis.
Soft tissues: Unremarkable.
IMPRESSION: 1.  No acute abnormality in the abdomen or pelvis.
2.  Large amount of stool in the colon.
3.  Small nonobstructive bilateral renal stones. No hydronephrosis.
4.  Unchanged dilation of the common bile duct since MRCP 09/24/2020, likely related to cholecystectomy.
Total radiation dose to patient is CTDIvol 16.58 mGy and DLP 741.00 mGy-cm.
